# Patient Record
Sex: Female | Born: 1995
Health system: Southern US, Community
[De-identification: ages and names within clinical notes are randomized; demographics above are authoritative.]

## PROBLEM LIST (undated history)

## (undated) DIAGNOSIS — N039 Chronic nephritic syndrome with unspecified morphologic changes: Secondary | ICD-10-CM

## (undated) DIAGNOSIS — N289 Disorder of kidney and ureter, unspecified: Secondary | ICD-10-CM

---
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
## 2014-10-29 IMAGING — CR DG CHEST 2V
2 series · 2 of 2 positions shown · non-contrast
Comparison: 05/14/2012

CLINICAL DATA: Chest pain

CHEST - 2 VIEW

[w chest pa]
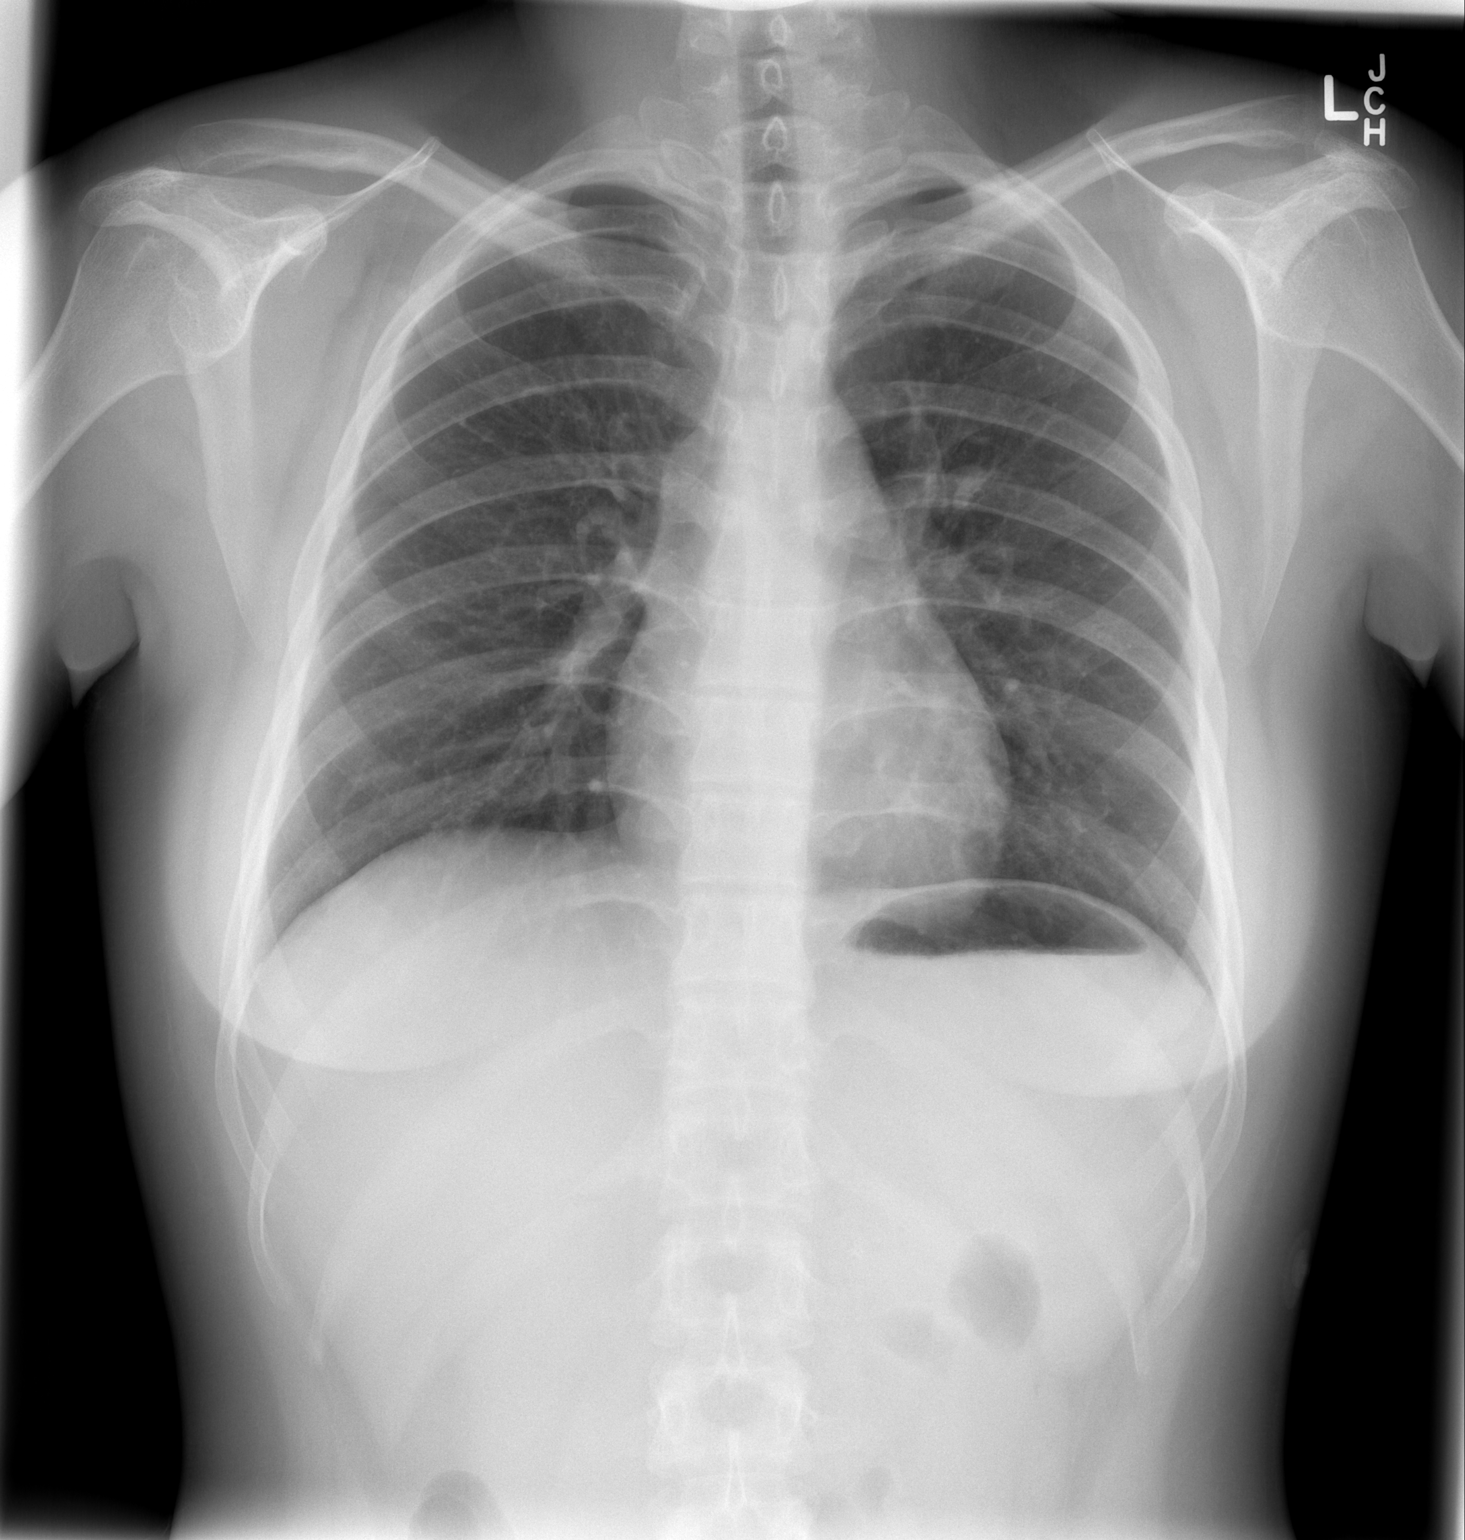

[w chest lat]
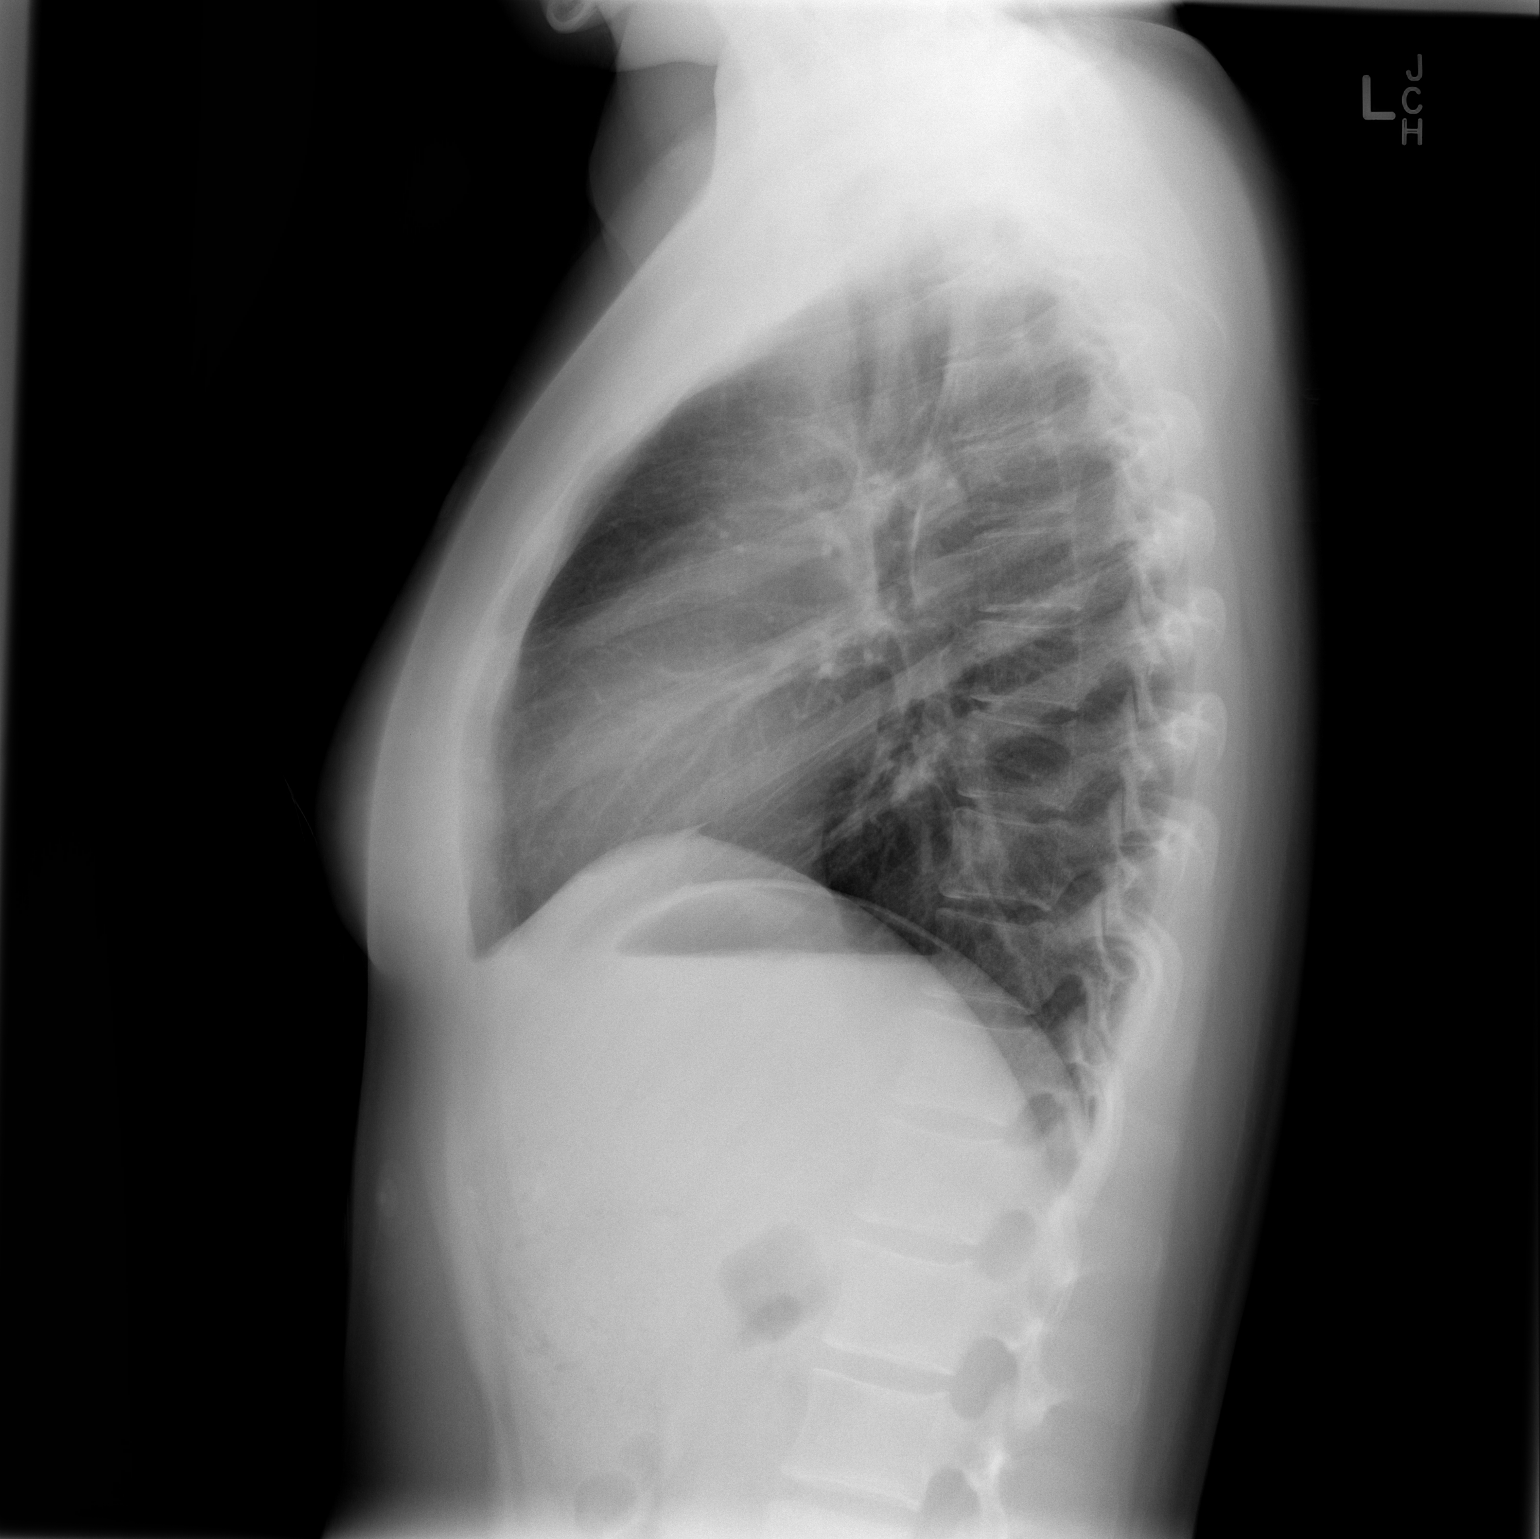

[2 of 2 positions shown; findings below may reference images not displayed]

FINDINGS: Normal heart size.  Clear lungs.  No pleural effusion.
No pneumothorax.
IMPRESSION: No active cardiopulmonary disease.

## 2017-01-17 ENCOUNTER — Other Ambulatory Visit (HOSPITAL_COMMUNITY)
Admission: RE | Admit: 2017-01-17 | Discharge: 2017-01-17 | Disposition: A | Discharge status: Home / Self Care | Payer: BLUE CROSS/BLUE SHIELD | Source: Ambulatory Visit | Attending: Family Medicine | Admitting: Family Medicine

## 2017-01-17 ENCOUNTER — Encounter: Payer: Self-pay | Admitting: Family Medicine

## 2017-01-17 ENCOUNTER — Ambulatory Visit (INDEPENDENT_AMBULATORY_CARE_PROVIDER_SITE_OTHER): Payer: BLUE CROSS/BLUE SHIELD | Admitting: Family Medicine

## 2017-01-17 ENCOUNTER — Ambulatory Visit (INDEPENDENT_AMBULATORY_CARE_PROVIDER_SITE_OTHER): Payer: BLUE CROSS/BLUE SHIELD

## 2017-01-17 VITALS — BP 126/78 | HR 83 | Temp 98.4°F | Ht 66.0 in | Wt 222.8 lb

## 2017-01-17 DIAGNOSIS — Z23 Encounter for immunization: Secondary | ICD-10-CM

## 2017-01-17 DIAGNOSIS — Z0000 Encounter for general adult medical examination without abnormal findings: Secondary | ICD-10-CM

## 2017-01-17 DIAGNOSIS — Z124 Encounter for screening for malignant neoplasm of cervix: Secondary | ICD-10-CM | POA: Diagnosis not present

## 2017-01-17 DIAGNOSIS — E669 Obesity, unspecified: Secondary | ICD-10-CM | POA: Diagnosis not present

## 2017-01-17 DIAGNOSIS — N915 Oligomenorrhea, unspecified: Secondary | ICD-10-CM | POA: Diagnosis not present

## 2017-01-17 LAB — LIPID PANEL
Cholesterol: 162 mg/dL (ref 0–200)
HDL: 38 mg/dL — ABNORMAL LOW (ref >39.00)
LDL Cholesterol: 100 mg/dL — ABNORMAL HIGH (ref 0–99)
NonHDL: 124.16
Total CHOL/HDL Ratio: 4
Triglycerides: 123 mg/dL (ref 0.0–149.0)
VLDL: 24.6 mg/dL (ref 0.0–40.0)

## 2017-01-17 LAB — CBC WITH DIFFERENTIAL/PLATELET
Basophils Absolute: 0 10*3/uL (ref 0.0–0.1)
Basophils Relative: 0.6 % (ref 0.0–3.0)
Eosinophils Absolute: 0.1 10*3/uL (ref 0.0–0.7)
Eosinophils Relative: 1.1 % (ref 0.0–5.0)
HCT: 41.4 % (ref 36.0–46.0)
Hemoglobin: 13.5 g/dL (ref 12.0–15.0)
Lymphocytes Relative: 42 % (ref 12.0–46.0)
Lymphs Abs: 2.6 10*3/uL (ref 0.7–4.0)
MCHC: 32.6 g/dL (ref 30.0–36.0)
MCV: 83.5 fl (ref 78.0–100.0)
Monocytes Absolute: 0.3 10*3/uL (ref 0.1–1.0)
Monocytes Relative: 5.5 % (ref 3.0–12.0)
Neutro Abs: 3.1 10*3/uL (ref 1.4–7.7)
Neutrophils Relative %: 50.8 % (ref 43.0–77.0)
Platelets: 263 10*3/uL (ref 150.0–400.0)
RBC: 4.96 Mil/uL (ref 3.87–5.11)
RDW: 13.3 % (ref 11.5–15.5)
WBC: 6.1 10*3/uL (ref 4.0–10.5)

## 2017-01-17 LAB — HEMOGLOBIN A1C: Hgb A1c MFr Bld: 6 % (ref 4.6–6.5)

## 2017-01-17 LAB — COMPREHENSIVE METABOLIC PANEL
ALT: 18 U/L (ref 0–35)
AST: 14 U/L (ref 0–37)
Albumin: 4.4 g/dL (ref 3.5–5.2)
Alkaline Phosphatase: 48 U/L (ref 39–117)
BUN: 11 mg/dL (ref 6–23)
CO2: 27 mEq/L (ref 19–32)
Calcium: 9.6 mg/dL (ref 8.4–10.5)
Chloride: 103 mEq/L (ref 96–112)
Creatinine, Ser: 0.68 mg/dL (ref 0.40–1.20)
GFR: 113.53 mL/min (ref >60.00)
Glucose, Bld: 99 mg/dL (ref 70–99)
Potassium: 3.9 mEq/L (ref 3.5–5.1)
Sodium: 137 mEq/L (ref 135–145)
Total Bilirubin: 0.5 mg/dL (ref 0.2–1.2)
Total Protein: 7.6 g/dL (ref 6.0–8.3)

## 2017-01-17 LAB — TSH: TSH: 2.14 u[IU]/mL (ref 0.35–4.50)

## 2017-01-17 LAB — TESTOSTERONE: Testosterone: 110.85 ng/dL — ABNORMAL HIGH (ref 15.00–40.00)

## 2017-01-17 LAB — POCT URINE PREGNANCY: Preg Test, Ur: NEGATIVE

## 2017-01-19 ENCOUNTER — Encounter: Payer: Self-pay | Admitting: Family Medicine

## 2017-01-19 LAB — CYTOLOGY - PAP
Adequacy: ABSENT
Candida vaginitis: NEGATIVE
Diagnosis: NEGATIVE

## 2017-01-20 LAB — PROLACTIN: Prolactin: 4.1 ng/mL

## 2017-01-20 LAB — DHEA-SULFATE: DHEA-SO4: 240 ug/dL (ref 18–391)

## 2017-01-20 LAB — 17-HYDROXYPROGESTERONE: 17-OH-Progesterone, LC/MS/MS: 50 ng/dL

## 2017-01-20 NOTE — Telephone Encounter (Signed)
Pt returned your call. She is scheduled to see Dr Earlene PlaterWallace on Tuesday but asked to speak with you. She said that if you were able to send her a message through MyChart to "set up a time" to speak with her she could come out of her office so that she will have cell service. Please advise.

## 2017-01-21 NOTE — Progress Notes (Signed)
Subjective:    Rebeca Valdivia is a 21 y.o. female and is here for a comprehensive physical exam.  Pertinent Gynecological History: Patient's last menstrual period was 11/28/2016. Sexually active: Yes with female. Menses: irregular and painful as well as heavy. Sexually transmitted diseases: no past history.  Health Maintenance Due  Topic Date Due  . HIV Screening  08/12/2008  . TETANUS/TDAP  08/12/2012  . CHLAMYDIA SCREENING  08/12/2013   PMHx, SurgHx, SocialHx, Medications, and Allergies were reviewed in the Visit Navigator and updated as appropriate.   Past Medical History:  Diagnosis Date  . Chronic nephritis   . Renal disorder    Family History  Problem Relation Age of Onset  . Kidney Stones Other    Social History  Substance Use Topics  . Smoking status: Never Smoker  . Smokeless tobacco: Never Used  . Alcohol use No   Review of Systems:   Pertinent items are noted in the HPI. Otherwise, ROS is negative.  Objective:   BP 126/78   Pulse 83   Temp 98.4 F (36.9 C) (Oral)   Ht 5\' 6"  (1.676 m)   Wt 222 lb 12.8 oz (101.1 kg)   LMP 11/28/2016   SpO2 98%   BMI 35.96 kg/m    Wt Readings from Last 3 Encounters:  01/17/17 222 lb 12.8 oz (101.1 kg)     Ht Readings from Last 3 Encounters:  01/17/17 5\' 6"  (1.676 m)   General appearance: alert, cooperative and appears stated age. Head: normocephalic, without obvious abnormality, atraumatic. Neck: no adenopathy, supple, symmetrical, trachea midline; thyroid not enlarged, symmetric, no tenderness/mass/nodules. Lungs: clear to auscultation bilaterally. Breasts: inspection negative, no nipple retraction or dimpling, no nipple discharge or bleeding, no axillary or supraclavicular adenopathy, normal to palpation without dominant masses. Heart: regular rate and rhythm Abdomen: soft, non-tender; no masses,  no organomegaly. Extremities: extremities normal, atraumatic, no cyanosis or edema. Skin: skin color, texture, turgor  normal, no rashes or lesions. Lymph: cervical, supraclavicular, and axillary nodes normal; no abnormal inguinal nodes palpated. Neurologic: grossly normal.  Pelvic:  External genitalia: no lesions.              Urethra: normal appearing urethra with no masses, tenderness or lesions.              Bartholins and Skenes: normal.               Vagina: normal appearing vagina with normal color and discharge, no lesions.              Cervix: normal appearance.              Pap and high risk HPV testing done: Yes.  .        Bimanual Exam:   Uterus: uterus is normal size, shape, consistency and nontender.                                      Adnexa: normal adnexa in size, nontender and no masses.                            Assessment/Plan:   Abbigael was seen today for establish care.  Diagnoses and all orders for this visit:  Routine physical examination -     CBC with Differential/Platelet -     Comprehensive metabolic panel -  TSH -     Lipid panel -     POCT urine pregnancy -     Prolactin -     Testosterone -     DHEA-sulfate -     17-Hydroxyprogesterone -     Hemoglobin A1c  Obesity (BMI 30-39.9) Comments: The patient is asked to make an attempt to improve diet and exercise patterns to aid in medical management of this problem.  Orders: -     CBC with Differential/Platelet -     Comprehensive metabolic panel -     TSH -     Lipid panel -     POCT urine pregnancy -     Prolactin -     Testosterone -     DHEA-sulfate -     17-Hydroxyprogesterone -     Hemoglobin A1c  Pap smear for cervical cancer screening -     CBC with Differential/Platelet -     Comprehensive metabolic panel -     TSH -     Lipid panel -     POCT urine pregnancy -     Prolactin -     Testosterone -     DHEA-sulfate -     17-Hydroxyprogesterone -     Hemoglobin A1c -     Cytology - PAP  Oligomenorrhea, unspecified type -     CBC with Differential/Platelet -     Comprehensive metabolic panel -      TSH -     Lipid panel -     POCT urine pregnancy -     Prolactin -     Testosterone -     DHEA-sulfate -     17-Hydroxyprogesterone -     Hemoglobin A1c   Patient Counseling:   [x]     Nutrition: Stressed importance of moderation in sodium/caffeine intake, saturated fat and cholesterol, caloric balance, sufficient intake of fresh fruits, vegetables, fiber, calcium, iron, and 1 mg of folate supplement per day (for females capable of pregnancy).   [x]      Stressed the importance of regular exercise.    [x]     Substance Abuse: Discussed cessation/primary prevention of tobacco, alcohol, or other drug use; driving or other dangerous activities under the influence; availability of treatment for abuse.    [x]      Injury prevention: Discussed safety belts, safety helmets, smoke detector, smoking near bedding or upholstery.    [x]      Sexuality: Discussed sexually transmitted diseases, partner selection, use of condoms, avoidance of unintended pregnancy  and contraceptive alternatives.    [x]     Dental health: Discussed importance of regular tooth brushing, flossing, and dental visits.   [x]      Health maintenance and immunizations reviewed. Please refer to Health maintenance section.   Helane RimaErica Torsha Lemus, DO [facility]

## 2017-01-24 ENCOUNTER — Ambulatory Visit (INDEPENDENT_AMBULATORY_CARE_PROVIDER_SITE_OTHER): Payer: BLUE CROSS/BLUE SHIELD | Admitting: Family Medicine

## 2017-01-24 ENCOUNTER — Encounter: Payer: Self-pay | Admitting: Family Medicine

## 2017-01-24 VITALS — BP 124/76 | HR 79 | Temp 98.1°F | Ht 66.0 in | Wt 222.0 lb

## 2017-01-24 DIAGNOSIS — E88819 Insulin resistance, unspecified: Secondary | ICD-10-CM | POA: Insufficient documentation

## 2017-01-24 DIAGNOSIS — N926 Irregular menstruation, unspecified: Secondary | ICD-10-CM | POA: Insufficient documentation

## 2017-01-24 DIAGNOSIS — E282 Polycystic ovarian syndrome: Secondary | ICD-10-CM | POA: Insufficient documentation

## 2017-01-24 DIAGNOSIS — E8881 Metabolic syndrome: Secondary | ICD-10-CM

## 2017-01-24 MED ORDER — NORGESTIMATE-ETH ESTRADIOL 0.25-35 MG-MCG PO TABS: 1.0000 | ORAL_TABLET | Freq: Every day | ORAL | 11 refills | Status: DC

## 2017-01-24 MED ORDER — METFORMIN HCL 1000 MG PO TABS: 1000.0000 mg | ORAL_TABLET | Freq: Every day | ORAL | 3 refills | Status: DC

## 2017-01-24 NOTE — Progress Notes (Signed)
Renee LlamasMia Gibbs is a 21 y.o. female is here for follow up.  History of Present Illness:   HPI: Lab review. See AP.  Health Maintenance Due  Topic Date Due  . HIV Screening  08/12/2008  . TETANUS/TDAP  08/12/2012  . CHLAMYDIA SCREENING  08/12/2013   Depression screen PHQ 2/9 01/17/2017  Decreased Interest 0  Down, Depressed, Hopeless 0  PHQ - 2 Score 0   PMHx, SurgHx, SocialHx, FamHx, Medications, and Allergies were reviewed in the Visit Navigator and updated as appropriate.  There are no active problems to display for this patient.  Social History  Substance Use Topics  . Smoking status: Never Smoker  . Smokeless tobacco: Never Used  . Alcohol use No   Current Medications and Allergies:   Current Outpatient Prescriptions:  .  metFORMIN (GLUCOPHAGE) 1000 MG tablet, Take 1 tablet (1,000 mg total) by mouth daily with breakfast., Disp: 30 tablet, Rfl: 3 .  norgestimate-ethinyl estradiol (SPRINTEC 28) 0.25-35 MG-MCG tablet, Take 1 tablet by mouth daily., Disp: 1 Package, Rfl: 11  No Known Allergies Review of Systems   Pertinent items are noted in the HPI. Otherwise, ROS is negative.  Vitals:   Vitals:   01/24/17 1514  BP: 124/76  Pulse: 79  Temp: 98.1 F (36.7 C)  TempSrc: Oral  SpO2: 97%  Weight: 222 lb (100.7 kg)  Height: 5\' 6"  (1.676 m)     Body mass index is 35.83 kg/m. Physical Exam:   Physical Exam  Constitutional: She appears well-nourished.  HENT:  Head: Normocephalic and atraumatic.  Eyes: Pupils are equal, round, and reactive to light. EOM are normal.  Neck: Normal range of motion. Neck supple.  Cardiovascular: Normal rate, regular rhythm, normal heart sounds and intact distal pulses.   Pulmonary/Chest: Effort normal.  Abdominal: Soft.  Skin: Skin is warm.  Psychiatric: She has a normal mood and affect. Her behavior is normal.  Nursing note and vitals reviewed.   Results for orders placed or performed in visit on 01/17/17  CBC with  Differential/Platelet  Result Value Ref Range   WBC 6.1 4.0 - 10.5 K/uL   RBC 4.96 3.87 - 5.11 Mil/uL   Hemoglobin 13.5 12.0 - 15.0 g/dL   HCT 57.841.4 46.936.0 - 62.946.0 %   MCV 83.5 78.0 - 100.0 fl   MCHC 32.6 30.0 - 36.0 g/dL   RDW 52.813.3 41.311.5 - 24.415.5 %   Platelets 263.0 150.0 - 400.0 K/uL   Neutrophils Relative % 50.8 43.0 - 77.0 %   Lymphocytes Relative 42.0 12.0 - 46.0 %   Monocytes Relative 5.5 3.0 - 12.0 %   Eosinophils Relative 1.1 0.0 - 5.0 %   Basophils Relative 0.6 0.0 - 3.0 %   Neutro Abs 3.1 1.4 - 7.7 K/uL   Lymphs Abs 2.6 0.7 - 4.0 K/uL   Monocytes Absolute 0.3 0.1 - 1.0 K/uL   Eosinophils Absolute 0.1 0.0 - 0.7 K/uL   Basophils Absolute 0.0 0.0 - 0.1 K/uL  Comprehensive metabolic panel  Result Value Ref Range   Sodium 137 135 - 145 mEq/L   Potassium 3.9 3.5 - 5.1 mEq/L   Chloride 103 96 - 112 mEq/L   CO2 27 19 - 32 mEq/L   Glucose, Bld 99 70 - 99 mg/dL   BUN 11 6 - 23 mg/dL   Creatinine, Ser 0.100.68 0.40 - 1.20 mg/dL   Total Bilirubin 0.5 0.2 - 1.2 mg/dL   Alkaline Phosphatase 48 39 - 117 U/L   AST 14  0 - 37 U/L   ALT 18 0 - 35 U/L   Total Protein 7.6 6.0 - 8.3 g/dL   Albumin 4.4 3.5 - 5.2 g/dL   Calcium 9.6 8.4 - 11.9 mg/dL   GFR 147.82 >95.62 mL/min  TSH  Result Value Ref Range   TSH 2.14 0.35 - 4.50 uIU/mL  Lipid panel  Result Value Ref Range   Cholesterol 162 0 - 200 mg/dL   Triglycerides 130.8 0.0 - 149.0 mg/dL   HDL 65.78 (L) >46.96 mg/dL   VLDL 29.5 0.0 - 28.4 mg/dL   LDL Cholesterol 132 (H) 0 - 99 mg/dL   Total CHOL/HDL Ratio 4    NonHDL 124.16   Prolactin  Result Value Ref Range   Prolactin 4.1 ng/mL  Testosterone  Result Value Ref Range   Testosterone 110.85 (H) 15.00 - 40.00 ng/dL  DHEA-sulfate  Result Value Ref Range   DHEA-SO4 240 18 - 391 mcg/dL  [id number]  Result Value Ref Range   17-OH-Progesterone, LC/MS/MS 50 ng/dL  Hemoglobin Z5G  Result Value Ref Range   Hgb A1c MFr Bld 6.0 4.6 - 6.5 %  POCT urine pregnancy  Result Value  Ref Range   Preg Test, Ur Negative Negative  Cytology - PAP  Result Value Ref Range   Adequacy      Satisfactory for evaluation  endocervical/transformation zone component ABSENT.   Diagnosis      NEGATIVE FOR INTRAEPITHELIAL LESIONS OR MALIGNANCY.   Candida vaginitis Negative for Candida species    Material Submitted CervicoVaginal Pap [ThinPrep Imaged]    CYTOLOGY - PAP PAP RESULT     Assessment and Plan:   Renee Gibbs was seen today for follow-up.  Diagnoses and all orders for this visit:  Insulin resistance -     metFORMIN (GLUCOPHAGE) 1000 MG tablet; Take 1 tablet (1,000 mg total) by mouth daily with breakfast.  PCOS (polycystic ovarian syndrome) Comments: The patient is asked to make an attempt to improve diet and exercise patterns to aid in medical management of this problem.   Irregular menses -     norgestimate-ethinyl estradiol (SPRINTEC 28) 0.25-35 MG-MCG tablet; Take 1 tablet by mouth daily.   . Reviewed expectations re: course of current medical issues. . Discussed self-management of symptoms. . Outlined signs and symptoms indicating need for more acute intervention. . Patient verbalized understanding and all questions were answered. Marland Kitchen Health Maintenance issues including appropriate healthy diet, exercise, and smoking avoidance were discussed with patient. . See orders for this visit as documented in the electronic medical record. . Patient received an After Visit Summary.   Renee Rima, DO [place] 01/24/2017

## 2017-01-25 ENCOUNTER — Encounter: Payer: Self-pay | Admitting: Family Medicine

## 2017-02-01 ENCOUNTER — Encounter: Payer: Self-pay | Admitting: Family Medicine

## 2017-05-02 ENCOUNTER — Encounter: Payer: Self-pay | Admitting: Family Medicine

## 2017-09-13 ENCOUNTER — Encounter: Payer: Self-pay | Admitting: Family Medicine

## 2017-09-28 NOTE — Progress Notes (Signed)
Subjective:    Renee Gibbs is a 22 y.o. female and is here for a comprehensive physical exam.  Current Outpatient Medications:  .  None  Health Maintenance Due  Topic Date Due  . HIV Screening  08/12/2008  . TETANUS/TDAP  08/12/2012   PMHx, SurgHx, SocialHx, Medications, and Allergies were reviewed in the Visit Navigator and updated as appropriate.   Past Medical History:  Diagnosis Date  . Chronic nephritis   . Renal disorder    History reviewed. No pertinent surgical history.  Family History  Problem Relation Age of Onset  . Kidney Stones Other    Social History   Tobacco Use  . Smoking status: Never Smoker  . Smokeless tobacco: Never Used  Substance Use Topics  . Alcohol use: No  . Drug use: No    Review of Systems:   Pertinent items are noted in the HPI. Otherwise, ROS is negative.  Objective:   BP 120/74 (BP Location: Left Arm, Patient Position: Sitting, Cuff Size: Normal)   Pulse 70   Temp 97.9 F (36.6 C) (Oral)   Ht 5\' 6"  (1.676 m)   Wt 207 lb 3.2 oz (94 kg)   SpO2 97%   BMI 33.44 kg/m    General appearance: alert, cooperative and appears stated age. Head: normocephalic, without obvious abnormality, atraumatic. Maxillary sinus ttp. No polyps.  Neck: no adenopathy, supple, symmetrical, trachea midline; thyroid not enlarged, symmetric, no tenderness/mass/nodules. Lungs: clear to auscultation bilaterally. Heart: regular rate and rhythm Abdomen: soft, non-tender; no masses,  no organomegaly. Extremities: extremities normal, atraumatic, no cyanosis or edema. Skin: skin color, texture, turgor normal, no rashes or lesions. Lymph: cervical, supraclavicular, and axillary nodes normal; no abnormal inguinal nodes palpated. Neurologic: grossly normal.  Assessment/Plan:   Renee Gibbs was seen today for annual exam.  Diagnoses and all orders for this visit:  Routine physical examination  Insulin resistance -     Hemoglobin A1c  PCOS (polycystic ovarian  syndrome) -     CBC with Differential/Platelet -     Comprehensive metabolic panel -     norgestimate-ethinyl estradiol (SPRINTEC 28) 0.25-35 MG-MCG tablet; Take 1 tablet by mouth daily.  Elevated LDL cholesterol level -     Lipid panel  Subacute maxillary sinusitis -     amoxicillin (AMOXIL) 875 MG tablet; Take 1 tablet (875 mg total) by mouth 2 (two) times daily. -     cetirizine (ZYRTEC) 10 MG tablet; Take 1 tablet (10 mg total) by mouth daily.  Irregular menses -     norgestimate-ethinyl estradiol (SPRINTEC 28) 0.25-35 MG-MCG tablet; Take 1 tablet by mouth daily.    Patient Counseling:   [x]     Nutrition: Stressed importance of moderation in sodium/caffeine intake, saturated fat and cholesterol, caloric balance, sufficient intake of fresh fruits, vegetables, fiber, calcium, iron, and 1 mg of folate supplement per day (for females capable of pregnancy).   [x]      Stressed the importance of regular exercise.    [x]     Substance Abuse: Discussed cessation/primary prevention of tobacco, alcohol, or other drug use; driving or other dangerous activities under the influence; availability of treatment for abuse.    [x]      Injury prevention: Discussed safety belts, safety helmets, smoke detector, smoking near bedding or upholstery.    [x]      Sexuality: Discussed sexually transmitted diseases, partner selection, use of condoms, avoidance of unintended pregnancy  and contraceptive alternatives.    [x]     Dental health: Discussed  importance of regular tooth brushing, flossing, and dental visits.   [x]      Health maintenance and immunizations reviewed. Please refer to Health maintenance section.   Helane Rima, DO [facility]

## 2017-09-29 ENCOUNTER — Encounter: Payer: Self-pay | Admitting: Family Medicine

## 2017-09-29 ENCOUNTER — Ambulatory Visit (INDEPENDENT_AMBULATORY_CARE_PROVIDER_SITE_OTHER): Payer: 59 | Admitting: Family Medicine

## 2017-09-29 VITALS — BP 120/74 | HR 70 | Temp 97.9°F | Ht 66.0 in | Wt 207.2 lb

## 2017-09-29 DIAGNOSIS — J0100 Acute maxillary sinusitis, unspecified: Secondary | ICD-10-CM

## 2017-09-29 DIAGNOSIS — E282 Polycystic ovarian syndrome: Secondary | ICD-10-CM | POA: Diagnosis not present

## 2017-09-29 DIAGNOSIS — E7800 Pure hypercholesterolemia, unspecified: Secondary | ICD-10-CM | POA: Insufficient documentation

## 2017-09-29 DIAGNOSIS — N926 Irregular menstruation, unspecified: Secondary | ICD-10-CM

## 2017-09-29 DIAGNOSIS — E8881 Metabolic syndrome: Secondary | ICD-10-CM

## 2017-09-29 DIAGNOSIS — E88819 Insulin resistance, unspecified: Secondary | ICD-10-CM

## 2017-09-29 DIAGNOSIS — Z0000 Encounter for general adult medical examination without abnormal findings: Secondary | ICD-10-CM

## 2017-09-29 LAB — CBC WITH DIFFERENTIAL/PLATELET
Basophils Absolute: 0 10*3/uL (ref 0.0–0.1)
Basophils Relative: 0.5 % (ref 0.0–3.0)
Eosinophils Absolute: 0.1 10*3/uL (ref 0.0–0.7)
Eosinophils Relative: 1.4 % (ref 0.0–5.0)
HCT: 41.6 % (ref 36.0–46.0)
Hemoglobin: 13.9 g/dL (ref 12.0–15.0)
Lymphocytes Relative: 41.2 % (ref 12.0–46.0)
Lymphs Abs: 2.3 10*3/uL (ref 0.7–4.0)
MCHC: 33.3 g/dL (ref 30.0–36.0)
MCV: 82.1 fl (ref 78.0–100.0)
Monocytes Absolute: 0.4 10*3/uL (ref 0.1–1.0)
Monocytes Relative: 6.9 % (ref 3.0–12.0)
Neutro Abs: 2.8 10*3/uL (ref 1.4–7.7)
Neutrophils Relative %: 50 % (ref 43.0–77.0)
Platelets: 251 10*3/uL (ref 150.0–400.0)
RBC: 5.06 Mil/uL (ref 3.87–5.11)
RDW: 13.5 % (ref 11.5–15.5)
WBC: 5.6 10*3/uL (ref 4.0–10.5)

## 2017-09-29 LAB — COMPREHENSIVE METABOLIC PANEL
ALT: 16 U/L (ref 0–35)
AST: 14 U/L (ref 0–37)
Albumin: 4.6 g/dL (ref 3.5–5.2)
Alkaline Phosphatase: 49 U/L (ref 39–117)
BUN: 14 mg/dL (ref 6–23)
CO2: 24 mEq/L (ref 19–32)
Calcium: 9.6 mg/dL (ref 8.4–10.5)
Chloride: 102 mEq/L (ref 96–112)
Creatinine, Ser: 0.65 mg/dL (ref 0.40–1.20)
GFR: 118.89 mL/min (ref >60.00)
Glucose, Bld: 89 mg/dL (ref 70–99)
Potassium: 3.9 mEq/L (ref 3.5–5.1)
Sodium: 137 mEq/L (ref 135–145)
Total Bilirubin: 0.4 mg/dL (ref 0.2–1.2)
Total Protein: 7.7 g/dL (ref 6.0–8.3)

## 2017-09-29 LAB — LIPID PANEL
Cholesterol: 152 mg/dL (ref 0–200)
HDL: 32.5 mg/dL — ABNORMAL LOW (ref >39.00)
LDL Cholesterol: 100 mg/dL — ABNORMAL HIGH (ref 0–99)
NonHDL: 119.29
Total CHOL/HDL Ratio: 5
Triglycerides: 96 mg/dL (ref 0.0–149.0)
VLDL: 19.2 mg/dL (ref 0.0–40.0)

## 2017-09-29 LAB — HEMOGLOBIN A1C: Hgb A1c MFr Bld: 5.7 % (ref 4.6–6.5)

## 2017-09-29 MED ORDER — AMOXICILLIN 875 MG PO TABS: 875.0000 mg | ORAL_TABLET | Freq: Two times a day (BID) | ORAL | 0 refills | Status: DC

## 2017-09-29 MED ORDER — CETIRIZINE HCL 10 MG PO TABS: 10.0000 mg | ORAL_TABLET | Freq: Every day | ORAL | 11 refills | Status: DC

## 2017-09-29 MED ORDER — NORGESTIMATE-ETH ESTRADIOL 0.25-35 MG-MCG PO TABS: 1.0000 | ORAL_TABLET | Freq: Every day | ORAL | 11 refills | Status: DC

## 2018-04-19 NOTE — Progress Notes (Signed)
Renee LlamasMia Gibbs is a 23 y.o. female is here for follow up.  History of Present Illness:   I, Renee Gibbs, acting as a Neurosurgeonscribe for W. R. BerkleyErica Nani Ingram, DO, have documented all relevant documentation on the behalf of Renee Renee Archit Leger, DO, as directed by  Renee Renee Gibbs Renee Evola, DO while in the presence of Renee Renee Gibbs Renee Ouk, DO.  HPI:   Insulin resistance Lab Results  Component Value Date   HGBA1C 5.4 04/20/2018   PCOS (polycystic ovarian syndrome) -     CBC with Differential/Platelet -     Comprehensive metabolic panel -     norgestimate-ethinyl estradiol (SPRINTEC 28) 0.25-35 MG-MCG tablet; Take 1 tablet by mouth daily.  Elevated LDL cholesterol level Checked at last visit on 09/29/2017.   Lab Results  Component Value Date   CHOL 152 09/29/2017   HDL 32.50 (L) 09/29/2017   LDLCALC 100 (H) 09/29/2017   TRIG 96.0 09/29/2017   CHOLHDL 5 09/29/2017   Lab Results  Component Value Date   ALT 16 09/29/2017   AST 14 09/29/2017   ALKPHOS 49 09/29/2017   BILITOT 0.4 09/29/2017     Irregular menses Worsened. Still irregular, heavy. -     norgestimate-ethinyl estradiol (SPRINTEC 28) 0.25-35 MG-MCG tablet; Take 1 tablet by mouth daily.  Health Maintenance Due  Topic Date Due  . HIV Screening  08/12/2008   [facility] 04/20/2018 09/29/2017 01/17/2017  Decreased Interest 1 0 0  Down, Depressed, Hopeless 1 0 0  PHQ - 2 Score 2 0 0  Altered sleeping 0 - -  Tired, decreased energy 2 - -  Change in appetite 0 - -  Feeling bad or failure about yourself  0 - -  Trouble concentrating 0 - -  Moving slowly or fidgety/restless 0 - -  Suicidal thoughts 0 - -  PHQ-9 Score 4 - -  Difficult doing work/chores Somewhat difficult - -   PMHx, SurgHx, SocialHx, FamHx, Medications, and Allergies were reviewed in the Visit Navigator and updated as appropriate.   Patient Active Problem List   Diagnosis Date Noted  . Elevated LDL cholesterol level 09/29/2017  . Insulin resistance 01/24/2017  . PCOS  (polycystic ovarian syndrome) 01/24/2017  . Irregular menses 01/24/2017   Social History   Tobacco Use  . Smoking status: Never Smoker  . Smokeless tobacco: Never Used  Substance Use Topics  . Alcohol use: No  . Drug use: No   Current Medications and Allergies:   .  cetirizine (ZYRTEC) 10 MG tablet, Take 1 tablet (10 mg total) by mouth daily. (Patient not taking: Reported on 04/20/2018), Disp: 30 tablet, Rfl: 11 .  Norgestimate-Ethinyl Estradiol Triphasic 0.18/0.215/0.25 MG-25 MCG tab, Take 1 tablet by mouth daily., Disp: 1 Package, Rfl: 11  Allergies  Allergen Reactions  . Metformin And Related Diarrhea     Review of Systems   Pertinent items are noted in the HPI. Otherwise, a complete ROS is negative.  Vitals:   Vitals:   04/20/18 0957  BP: 110/80  Pulse: 73  SpO2: 98%  Weight: 203 lb (92.1 kg)  Height: 5\' 6"  (1.676 m)     Body mass index is 32.77 kg/m.  Physical Exam:   Physical Exam Vitals signs and nursing note reviewed.  HENT:     Head: Normocephalic and atraumatic.  Eyes:     Pupils: Pupils are equal, round, and reactive to light.  Neck:     Musculoskeletal: Normal range of motion and neck supple.  Cardiovascular:  Rate and Rhythm: Normal rate and regular rhythm.     Heart sounds: Normal heart sounds.  Pulmonary:     Effort: Pulmonary effort is normal.  Abdominal:     Palpations: Abdomen is soft.  Skin:    General: Skin is warm.  Psychiatric:        Behavior: Behavior normal.    Results for orders placed or performed in visit on 04/20/18  POCT glycosylated hemoglobin (Hb A1C)  Result Value Ref Range   Hemoglobin A1C 5.4 4.0 - 5.6 %   HbA1c POC (<> result, manual entry)     HbA1c, POC (prediabetic range)     HbA1c, POC (controlled diabetic range)    POCT urine pregnancy  Result Value Ref Range   Preg Test, Ur Negative Negative  POCT Urinalysis Dipstick  Result Value Ref Range   Color, UA Yellow    Clarity, UA Clear    Glucose, UA  Negative Negative   Bilirubin, UA Negative    Ketones, UA Negative    Spec Grav, UA 1.025 1.010 - 1.025   Blood, UA Positive    pH, UA 6.5 5.0 - 8.0   Protein, UA Negative Negative   Urobilinogen, UA 2.0 (A) 0.2 or 1.0 E.U./dL   Nitrite, UA Negative    Leukocytes, UA Negative Negative   Appearance     Odor     Assessment and Plan:   Renee Gibbs was seen today for follow-up.  Diagnoses and all orders for this visit:  Insulin resistance -     POCT glycosylated hemoglobin (Hb A1C)  PCOS (polycystic ovarian syndrome)  Elevated LDL cholesterol level  Irregular menses -     Norgestimate-Ethinyl Estradiol Triphasic 0.18/0.215/0.25 MG-25 MCG tab; Take 1 tablet by mouth daily.  Need for Tdap vaccination -     Tdap vaccine greater than or equal to 7yo IM  Need for influenza vaccination -     Flu Vaccine QUAD 6+ mos PF IM (Fluarix Quad PF)  Lower abdominal pain -     POCT urine pregnancy -     POCT Urinalysis Dipstick    . Orders and follow up as documented in EpicCare, reviewed diet, exercise and weight control, cardiovascular risk and specific lipid/LDL goals reviewed, reviewed medications and side effects in detail.  . Reviewed expectations re: course of current medical issues. . Outlined signs and symptoms indicating need for more acute intervention. . Patient verbalized understanding and all questions were answered. . Patient received an After Visit Summary.  CMA served as Neurosurgeon during this visit. History, Physical, and Plan performed by medical provider. The above documentation has been reviewed and is accurate and complete. Renee Rima, D.O.  Renee Rima, DO [place] 04/22/2018

## 2018-04-20 ENCOUNTER — Encounter: Payer: Self-pay | Admitting: Family Medicine

## 2018-04-20 ENCOUNTER — Ambulatory Visit (INDEPENDENT_AMBULATORY_CARE_PROVIDER_SITE_OTHER): Payer: BLUE CROSS/BLUE SHIELD | Admitting: Family Medicine

## 2018-04-20 VITALS — BP 110/80 | HR 73 | Ht 66.0 in | Wt 203.0 lb

## 2018-04-20 DIAGNOSIS — N926 Irregular menstruation, unspecified: Secondary | ICD-10-CM | POA: Diagnosis not present

## 2018-04-20 DIAGNOSIS — R1030 Lower abdominal pain, unspecified: Secondary | ICD-10-CM | POA: Diagnosis not present

## 2018-04-20 DIAGNOSIS — E7800 Pure hypercholesterolemia, unspecified: Secondary | ICD-10-CM | POA: Diagnosis not present

## 2018-04-20 DIAGNOSIS — E282 Polycystic ovarian syndrome: Secondary | ICD-10-CM

## 2018-04-20 DIAGNOSIS — E8881 Metabolic syndrome: Secondary | ICD-10-CM

## 2018-04-20 DIAGNOSIS — Z23 Encounter for immunization: Secondary | ICD-10-CM | POA: Diagnosis not present

## 2018-04-20 LAB — POCT URINALYSIS DIPSTICK
Bilirubin, UA: NEGATIVE
Blood, UA: POSITIVE
Glucose, UA: NEGATIVE
Ketones, UA: NEGATIVE
Leukocytes, UA: NEGATIVE
Nitrite, UA: NEGATIVE
Protein, UA: NEGATIVE
Spec Grav, UA: 1.025 (ref 1.010–1.025)
Urobilinogen, UA: 2 E.U./dL — AB
pH, UA: 6.5 (ref 5.0–8.0)

## 2018-04-20 LAB — POCT GLYCOSYLATED HEMOGLOBIN (HGB A1C): Hemoglobin A1C: 5.4 % (ref 4.0–5.6)

## 2018-04-20 LAB — POCT URINE PREGNANCY: Preg Test, Ur: NEGATIVE

## 2018-04-20 MED ORDER — NORGESTIM-ETH ESTRAD TRIPHASIC 0.18/0.215/0.25 MG-25 MCG PO TABS: 1.0000 | ORAL_TABLET | Freq: Every day | ORAL | 11 refills | Status: DC

## 2018-04-22 ENCOUNTER — Encounter: Payer: Self-pay | Admitting: Family Medicine

## 2018-10-19 ENCOUNTER — Encounter: Payer: BLUE CROSS/BLUE SHIELD | Admitting: Family Medicine

## 2018-10-31 ENCOUNTER — Other Ambulatory Visit: Payer: Self-pay

## 2018-10-31 ENCOUNTER — Ambulatory Visit (INDEPENDENT_AMBULATORY_CARE_PROVIDER_SITE_OTHER): Payer: BC Managed Care – PPO | Admitting: Family Medicine

## 2018-10-31 ENCOUNTER — Encounter: Payer: Self-pay | Admitting: Family Medicine

## 2018-10-31 ENCOUNTER — Ambulatory Visit: Payer: Self-pay | Admitting: Family Medicine

## 2018-10-31 VITALS — BP 112/72 | HR 72 | Temp 98.2°F | Resp 16 | Ht 66.0 in | Wt 194.6 lb

## 2018-10-31 DIAGNOSIS — K649 Unspecified hemorrhoids: Secondary | ICD-10-CM | POA: Diagnosis not present

## 2018-10-31 DIAGNOSIS — E282 Polycystic ovarian syndrome: Secondary | ICD-10-CM | POA: Diagnosis not present

## 2018-10-31 DIAGNOSIS — N926 Irregular menstruation, unspecified: Secondary | ICD-10-CM | POA: Diagnosis not present

## 2018-10-31 LAB — POCT URINE PREGNANCY: Preg Test, Ur: NEGATIVE

## 2018-10-31 MED ORDER — HYDROCORTISONE ACETATE 30 MG RE SUPP: RECTAL | 0 refills | Status: DC

## 2018-10-31 NOTE — Progress Notes (Signed)
Subjective  CC:  Chief Complaint  Patient presents with  . Rectal Bleeding    She reports this happens after BMs, has had several episodes. Last night she reports there was a blood clot after having BM.Marland Kitchen She states that she was constipated and strain to use the bathroom   Same day acute visit; PCP not available. New pt to me. Chart reviewed.   HPI: Renee Gibbs is a 23 y.o. female who presents to the office today to address the problems listed above in the chief complaint.  23 yo with above reported problems. Admits to 2-3 episodes of BRBPR, mostly noted on TP when wiping. Normal stooling although a little constipated recently for unclear reasons. Admits to this happening in past intermittently over 2 years. Has felt extra "tissue" near rectum. Nonpainful defecation although has to strain at times. No abdominal pain, upper GI sxs. She is worried she has stomach cancer.   Ros neg for f/c/s, weight loss, postprandial pain, reflux sxs or melena.   PCOS with h/o irregular periods stopped ocps; now late 8 days for menses. No pelvic pain.    Assessment  1. Hemorrhoids, unspecified hemorrhoid type   2. Late menses   3. PCOS (polycystic ovarian syndrome)      Plan   hemorrhoids:  Reassured. Treat with steroid cream/supp and avoid constipation/straining. F/u if needed  Check upreg.   Overdue for cpe. Pt to schedule.   Follow up: Return for complete physical with Dr. Juleen China.  Visit date not found  Orders Placed This Encounter  Procedures  . POCT urine pregnancy   Meds ordered this encounter  Medications  . HYDROCORTISONE ACE, RECTAL, 30 MG SUPP    Sig: Place PR nightly x 5 days, then prn    Dispense:  28 suppository    Refill:  0    May substitute any affordable rectal steroid supp or cream equivalent. Thanks.      I reviewed the patients updated PMH, FH, and SocHx.    Patient Active Problem List   Diagnosis Date Noted  . Elevated LDL cholesterol level 09/29/2017  . Insulin  resistance 01/24/2017  . PCOS (polycystic ovarian syndrome) 01/24/2017  . Irregular menses 01/24/2017   No outpatient medications have been marked as taking for the 10/31/18 encounter (Office Visit) with Leamon Arnt, MD.    Allergies: Patient is allergic to metformin and related. Family History: Patient family history includes Kidney Stones in an other family member. Social History:  Patient  reports that she has never smoked. She has never used smokeless tobacco. She reports that she does not drink alcohol or use drugs.  Review of Systems: Constitutional: Negative for fever malaise or anorexia Cardiovascular: negative for chest pain Respiratory: negative for SOB or persistent cough Gastrointestinal: negative for abdominal pain  Objective  Vitals: BP 112/72   Pulse 72   Temp 98.2 F (36.8 C) (Tympanic)   Resp 16   Ht 5\' 6"  (1.676 m)   Wt 194 lb 9.6 oz (88.3 kg)   LMP 09/21/2018   SpO2 96%   BMI 31.41 kg/m  General: no acute distress , A&Ox3 HEENT: PEERL, conjunctiva normal, Oropharynx moist,neck is supple Cardiovascular:  RRR without murmur or gallop.  Respiratory:  Good breath sounds bilaterally, CTAB with normal respiratory effort Skin:  Warm, no rashes Gastrointestinal: soft, flat abdomen, normal active bowel sounds, no palpable masses, no hepatosplenomegaly, no appreciated hernias Rectal exam: external, nontender, hemorrhoid present.  Anoscopy with small internal hemorrhoid. Non-bleeding. No  fissures. No stool in vault. No mass     Commons side effects, risks, benefits, and alternatives for medications and treatment plan prescribed today were discussed, and the patient expressed understanding of the given instructions. Patient is instructed to call or message via MyChart if he/she has any questions or concerns regarding our treatment plan. No barriers to understanding were identified. We discussed Red Flag symptoms and signs in detail. Patient expressed understanding  regarding what to do in case of urgent or emergency type symptoms.   Medication list was reconciled, printed and provided to the patient in AVS. Patient instructions and summary information was reviewed with the patient as documented in the AVS. This note was prepared with assistance of Dragon voice recognition software. Occasional wrong-word or sound-a-like substitutions may have occurred due to the inherent limitations of voice recognition software

## 2018-10-31 NOTE — Patient Instructions (Addendum)
Please return for your annual complete physical; please come fasting with Dr. Earlene PlaterWallace. Your last was in July 2019.  If you have any questions or concerns, please don't hesitate to send me a message via MyChart or call the office at [phone]. Thank you for visiting with us today! It's our pleasure caring for you.   Hemorrhoids Hemorrhoids are swollen veins in and around the rectum or anus. There are two types of hemorrhoids:  Internal hemorrhoids. These occur in the veins that are just inside the rectum. They may poke through to the outside and become irritated and painful.  External hemorrhoids. These occur in the veins that are outside the anus and can be felt as a painful swelling or hard lump near the anus. Most hemorrhoids do not cause serious problems, and they can be managed with home treatments such as diet and lifestyle changes. If home treatments do not help the symptoms, procedures can be done to shrink or remove the hemorrhoids. What are the causes? This condition is caused by increased pressure in the anal area. This pressure may result from various things, including:  Constipation.  Straining to have a bowel movement.  Diarrhea.  Pregnancy.  Obesity.  Sitting for long periods of time.  Heavy lifting or other activity that causes you to strain.  Anal sex.  Riding a bike for a long period of time. What are the signs or symptoms? Symptoms of this condition include:  Pain.  Anal itching or irritation.  Rectal bleeding.  Leakage of stool (feces).  Anal swelling.  One or more lumps around the anus. How is this diagnosed? This condition can often be diagnosed through a visual exam. Other exams or tests may also be done, such as:  An exam that involves feeling the rectal area with a gloved hand (digital rectal exam).  An exam of the anal canal that is done using a small tube (anoscope).  A blood test, if you have lost a significant amount of blood.  A  test to look inside the colon using a flexible tube with a camera on the end (sigmoidoscopy or colonoscopy). How is this treated? This condition can usually be treated at home. However, various procedures may be done if dietary changes, lifestyle changes, and other home treatments do not help your symptoms. These procedures can help make the hemorrhoids smaller or remove them completely. Some of these procedures involve surgery, and others do not. Common procedures include:  Rubber band ligation. Rubber bands are placed at the base of the hemorrhoids to cut off their blood supply.  Sclerotherapy. Medicine is injected into the hemorrhoids to shrink them.  Infrared coagulation. A type of light energy is used to get rid of the hemorrhoids.  Hemorrhoidectomy surgery. The hemorrhoids are surgically removed, and the veins that supply them are tied off.  Stapled hemorrhoidopexy surgery. The surgeon staples the base of the hemorrhoid to the rectal wall. Follow these instructions at home: Eating and drinking   Eat foods that have a lot of fiber in them, such as whole grains, beans, nuts, fruits, and vegetables.  Ask your health care provider about taking products that have added fiber (fiber supplements).  Reduce the amount of fat in your diet. You can do this by eating low-fat dairy products, eating less red meat, and avoiding processed foods.  Drink enough fluid to keep your urine pale yellow. Managing pain and swelling   Take warm sitz baths for 20 minutes, 3-4 times a day to ease  pain and discomfort. You may do this in a bathtub or using a portable sitz bath that fits over the toilet.  If directed, apply ice to the affected area. Using ice packs between sitz baths may be helpful. ? Put ice in a plastic bag. ? Place a towel between your skin and the bag. ? Leave the ice on for 20 minutes, 2-3 times a day. General instructions  Take over-the-counter and prescription medicines only as told  by your health care provider.  Use medicated creams or suppositories as told.  Get regular exercise. Ask your health care provider how much and what kind of exercise is best for you. In general, you should do moderate exercise for at least 30 minutes on most days of the week (150 minutes each week). This can include activities such as walking, biking, or yoga.  Go to the bathroom when you have the urge to have a bowel movement. Do not wait.  Avoid straining to have bowel movements.  Keep the anal area dry and clean. Use wet toilet paper or moist towelettes after a bowel movement.  Do not sit on the toilet for long periods of time. This increases blood pooling and pain.  Keep all follow-up visits as told by your health care provider. This is important. Contact a health care provider if you have:  Increasing pain and swelling that are not controlled by treatment or medicine.  Difficulty having a bowel movement, or you are unable to have a bowel movement.  Pain or inflammation outside the area of the hemorrhoids. Get help right away if you have:  Uncontrolled bleeding from your rectum. Summary  Hemorrhoids are swollen veins in and around the rectum or anus.  Most hemorrhoids can be managed with home treatments such as diet and lifestyle changes.  Taking warm sitz baths can help ease pain and discomfort.  In severe cases, procedures or surgery can be done to shrink or remove the hemorrhoids. This information is not intended to replace advice given to you by your health care provider. Make sure you discuss any questions you have with your health care provider. Document Released: 03/11/2000 Document Revised: 03/22/2018 Document Reviewed: 08/03/2017 Elsevier Patient Education  2020 [employer]   Constipation, Adult Constipation is when a person has fewer bowel movements in a week than normal, has difficulty having a bowel movement, or has stools that are dry, hard, or larger than  normal. Constipation may be caused by an underlying condition. It may become worse with age if a person takes certain medicines and does not take in enough fluids. Follow these instructions at home: Eating and drinking   Eat foods that have a lot of fiber, such as fresh fruits and vegetables, whole grains, and beans.  Limit foods that are high in fat, low in fiber, or overly processed, such as french fries, hamburgers, cookies, candies, and soda.  Drink enough fluid to keep your urine clear or pale yellow. General instructions  Exercise regularly or as told by your health care provider.  Go to the restroom when you have the urge to go. Do not hold it in.  Take over-the-counter and prescription medicines only as told by your health care provider. These include any fiber supplements.  Practice pelvic floor retraining exercises, such as deep breathing while relaxing the lower abdomen and pelvic floor relaxation during bowel movements.  Watch your condition for any changes.  Keep all follow-up visits as told by your health care provider. This is important.  Contact a health care provider if:  You have pain that gets worse.  You have a fever.  You do not have a bowel movement after 4 days.  You vomit.  You are not hungry.  You lose weight.  You are bleeding from the anus.  You have thin, pencil-like stools. Get help right away if:  You have a fever and your symptoms suddenly get worse.  You leak stool or have blood in your stool.  Your abdomen is bloated.  You have severe pain in your abdomen.  You feel dizzy or you faint. This information is not intended to replace advice given to you by your health care provider. Make sure you discuss any questions you have with your health care provider. Document Released: 12/11/2003 Document Revised: 02/24/2017 Document Reviewed: 09/02/2015 Elsevier Patient Education  2020 [employer].

## 2018-10-31 NOTE — Telephone Encounter (Signed)
Patient added to you today at 2pm. Patient states last night she had "clot of bright red blood" after having bowel movement about the size of thumb nail. She has history of constipation in the past. She has noticed bright red blood after bowel movement when whipping. No other symptoms. LMP is three days late.

## 2018-10-31 NOTE — Telephone Encounter (Signed)
Pt. Reports she started seeing blood when she wipes after a bowel movement 1 month ago. Yesterday she passed a blood clot. No fever or abdominal pain. Reports stools have been hard. Spoke with [facility] in the practice and will forward triage. Answer Assessment - Initial Assessment Questions 1. APPEARANCE of BLOOD: "What color is it?" "Is it passed separately, on the surface of the stool, or mixed in with the stool?"      Bright red 2. AMOUNT: "How much blood was passed?"      Moderate 3. FREQUENCY: "How many times has blood been passed with the stools?"      2 times 4. ONSET: "When was the blood first seen in the stools?" (Days or weeks)      I month ago 5. DIARRHEA: "Is there also some diarrhea?" If so, ask: "How many diarrhea stools were passed in past 24 hours?"      No 6. CONSTIPATION: "Do you have constipation?" If so, "How bad is it?"     Stools hard 7. RECURRENT SYMPTOMS: "Have you had blood in your stools before?" If so, ask: "When was the last time?" and "What happened that time?"        No 8. BLOOD THINNERS: "Do you take any blood thinners?" (e.g., Coumadin/warfarin, Pradaxa/dabigatran, aspirin)     No 9. OTHER SYMPTOMS: "Do you have any other symptoms?"  (e.g., abdominal pain, vomiting, dizziness, fever)     No 10. PREGNANCY: "Is there any chance you are pregnant?" "When was your last menstrual period?"       No  Protocols used: RECTAL BLEEDING-A-AH

## 2018-11-15 NOTE — Progress Notes (Deleted)
   Subjective:    Renee Gibbs is a 23 y.o. female and is here for a comprehensive physical exam.   Current Outpatient Medications:  .  HYDROCORTISONE ACE, RECTAL, 30 MG SUPP, Place PR nightly x 5 days, then prn, Disp: 28 suppository, Rfl: 0  Health Maintenance Due  Topic Date Due  . HIV Screening  08/12/2008  . INFLUENZA VACCINE  10/27/2018    PMHx, SurgHx, SocialHx, Medications, and Allergies were reviewed in the Visit Navigator and updated as appropriate.   Past Medical History:  Diagnosis Date  . Chronic nephritis   . Renal disorder     No past surgical history on file.  Family History  Problem Relation Age of Onset  . Kidney Stones Other     Social History   Tobacco Use  . Smoking status: Never Smoker  . Smokeless tobacco: Never Used  Substance Use Topics  . Alcohol use: No  . Drug use: No    Review of Systems:   Pertinent items are noted in the HPI. Otherwise, ROS is negative.  Objective:   There were no vitals taken for this visit.   General appearance: alert, cooperative and appears stated age. Head: normocephalic, without obvious abnormality, atraumatic. Neck: no adenopathy, supple, symmetrical, trachea midline; thyroid not enlarged, symmetric, no tenderness/mass/nodules. Lungs: clear to auscultation bilaterally. Breasts: inspection negative, no nipple retraction or dimpling, no nipple discharge or bleeding, no axillary or supraclavicular adenopathy, normal to palpation without dominant masses. Heart: regular rate and rhythm Abdomen: soft, non-tender; no masses,  no organomegaly. Extremities: extremities normal, atraumatic, no cyanosis or edema. Skin: skin color, texture, turgor normal, no rashes or lesions. Lymph: cervical, supraclavicular, and axillary nodes normal; no abnormal inguinal nodes palpated. Neurologic: grossly normal.  Pelvic:  External genitalia: no lesions. Urethra: normal appearing urethra with no masses, tenderness or lesions.  Bartholins and Skenes: normal. Vagina: normal appearing vagina with normal color and discharge, no lesions. Cervix: normal appearance. Pap and high risk HPV testing done: {yes no:314532} Bimanual Exam:   Uterus: uterus is normal size, shape, consistency and nontender. Adnexa: normal adnexa in size, nontender and no masses.  Assessment/Plan:   There are no diagnoses linked to this encounter.  Patient Counseling:   [x]     Nutrition: Stressed importance of moderation in sodium/caffeine intake, saturated fat and cholesterol, caloric balance, sufficient intake of fresh fruits, vegetables, fiber, calcium, iron, and 1 mg of folate supplement per day (for females capable of pregnancy).   [x]      Stressed the importance of regular exercise.    [x]     Substance Abuse: Discussed cessation/primary prevention of tobacco, alcohol, or other drug use; driving or other dangerous activities under the influence; availability of treatment for abuse.    [x]      Injury prevention: Discussed safety belts, safety helmets, smoke detector, smoking near bedding or upholstery.    [x]      Sexuality: Discussed sexually transmitted diseases, partner selection, use of condoms, avoidance of unintended pregnancy  and contraceptive alternatives.    [x]     Dental health: Discussed importance of regular tooth brushing, flossing, and dental visits.   [x]      Health maintenance and immunizations reviewed. Please refer to Health maintenance section.   Briscoe Deutscher, DO [place]

## 2018-11-16 ENCOUNTER — Encounter: Payer: BC Managed Care – PPO | Admitting: Family Medicine

## 2018-11-26 ENCOUNTER — Other Ambulatory Visit (HOSPITAL_COMMUNITY)
Admission: RE | Admit: 2018-11-26 | Discharge: 2018-11-26 | Disposition: A | Discharge status: Home / Self Care | Payer: BC Managed Care – PPO | Source: Ambulatory Visit | Attending: Family Medicine | Admitting: Family Medicine

## 2018-11-26 ENCOUNTER — Other Ambulatory Visit: Payer: Self-pay

## 2018-11-26 ENCOUNTER — Encounter: Payer: Self-pay | Admitting: Family Medicine

## 2018-11-26 ENCOUNTER — Ambulatory Visit (INDEPENDENT_AMBULATORY_CARE_PROVIDER_SITE_OTHER): Payer: BC Managed Care – PPO | Admitting: Family Medicine

## 2018-11-26 VITALS — BP 120/72 | HR 91 | Temp 97.7°F | Ht 66.0 in | Wt 195.4 lb

## 2018-11-26 DIAGNOSIS — E669 Obesity, unspecified: Secondary | ICD-10-CM | POA: Diagnosis not present

## 2018-11-26 DIAGNOSIS — R5381 Other malaise: Secondary | ICD-10-CM | POA: Diagnosis not present

## 2018-11-26 DIAGNOSIS — E7800 Pure hypercholesterolemia, unspecified: Secondary | ICD-10-CM | POA: Insufficient documentation

## 2018-11-26 DIAGNOSIS — Z3169 Encounter for other general counseling and advice on procreation: Secondary | ICD-10-CM | POA: Insufficient documentation

## 2018-11-26 DIAGNOSIS — Z113 Encounter for screening for infections with a predominantly sexual mode of transmission: Secondary | ICD-10-CM

## 2018-11-26 DIAGNOSIS — E8881 Metabolic syndrome: Secondary | ICD-10-CM | POA: Diagnosis not present

## 2018-11-26 DIAGNOSIS — E282 Polycystic ovarian syndrome: Secondary | ICD-10-CM | POA: Diagnosis not present

## 2018-11-26 DIAGNOSIS — E88819 Insulin resistance, unspecified: Secondary | ICD-10-CM

## 2018-11-26 DIAGNOSIS — R5383 Other fatigue: Secondary | ICD-10-CM | POA: Diagnosis not present

## 2018-11-26 DIAGNOSIS — Z0000 Encounter for general adult medical examination without abnormal findings: Secondary | ICD-10-CM

## 2018-11-26 DIAGNOSIS — Z124 Encounter for screening for malignant neoplasm of cervix: Secondary | ICD-10-CM

## 2018-11-26 LAB — CBC WITH DIFFERENTIAL/PLATELET
Basophils Absolute: 0 10*3/uL (ref 0.0–0.1)
Basophils Relative: 0.4 % (ref 0.0–3.0)
Eosinophils Absolute: 0.1 10*3/uL (ref 0.0–0.7)
Eosinophils Relative: 1.2 % (ref 0.0–5.0)
HCT: 38.9 % (ref 36.0–46.0)
Hemoglobin: 12.9 g/dL (ref 12.0–15.0)
Lymphocytes Relative: 39 % (ref 12.0–46.0)
Lymphs Abs: 2.2 10*3/uL (ref 0.7–4.0)
MCHC: 33.1 g/dL (ref 30.0–36.0)
MCV: 82.4 fl (ref 78.0–100.0)
Monocytes Absolute: 0.3 10*3/uL (ref 0.1–1.0)
Monocytes Relative: 5.7 % (ref 3.0–12.0)
Neutro Abs: 3 10*3/uL (ref 1.4–7.7)
Neutrophils Relative %: 53.7 % (ref 43.0–77.0)
Platelets: 245 10*3/uL (ref 150.0–400.0)
RBC: 4.73 Mil/uL (ref 3.87–5.11)
RDW: 12.9 % (ref 11.5–15.5)
WBC: 5.6 10*3/uL (ref 4.0–10.5)

## 2018-11-26 LAB — LIPID PANEL
Cholesterol: 165 mg/dL (ref 0–200)
HDL: 41.9 mg/dL (ref >39.00)
LDL Cholesterol: 101 mg/dL — ABNORMAL HIGH (ref 0–99)
NonHDL: 123.49
Total CHOL/HDL Ratio: 4
Triglycerides: 113 mg/dL (ref 0.0–149.0)
VLDL: 22.6 mg/dL (ref 0.0–40.0)

## 2018-11-26 LAB — COMPREHENSIVE METABOLIC PANEL
ALT: 9 U/L (ref 0–35)
AST: 10 U/L (ref 0–37)
Albumin: 4.6 g/dL (ref 3.5–5.2)
Alkaline Phosphatase: 44 U/L (ref 39–117)
BUN: 12 mg/dL (ref 6–23)
CO2: 28 mEq/L (ref 19–32)
Calcium: 9.7 mg/dL (ref 8.4–10.5)
Chloride: 103 mEq/L (ref 96–112)
Creatinine, Ser: 0.68 mg/dL (ref 0.40–1.20)
GFR: 105.18 mL/min (ref >60.00)
Glucose, Bld: 87 mg/dL (ref 70–99)
Potassium: 4.2 mEq/L (ref 3.5–5.1)
Sodium: 139 mEq/L (ref 135–145)
Total Bilirubin: 0.6 mg/dL (ref 0.2–1.2)
Total Protein: 7.6 g/dL (ref 6.0–8.3)

## 2018-11-26 LAB — MAGNESIUM: Magnesium: 1.9 mg/dL (ref 1.5–2.5)

## 2018-11-26 LAB — VITAMIN B12: Vitamin B-12: 602 pg/mL (ref 211–911)

## 2018-11-26 LAB — HEMOGLOBIN A1C: Hgb A1c MFr Bld: 5.7 % (ref 4.6–6.5)

## 2018-11-26 LAB — TSH: TSH: 1.71 u[IU]/mL (ref 0.35–4.50)

## 2018-11-26 NOTE — Progress Notes (Signed)
Subjective:    Renee Gibbs is a 23 y.o. female and is here for a comprehensive physical exam.  Patient is interested in conception in about a year.  Currently using condoms for protection.  History of PCOS.  Not on medication at this time.  Had severe diarrhea with metformin previously.  No current outpatient medications on file.  Health Maintenance Due  Topic Date Due  . HIV Screening  08/12/2008  . PAP SMEAR-Modifier  01/17/2018  . INFLUENZA VACCINE  10/27/2018   PMHx, SurgHx, SocialHx, Medications, and Allergies were reviewed in the Visit Navigator and updated as appropriate.   Past Medical History:  Diagnosis Date  . Chronic nephritis   . Renal disorder    History reviewed. No pertinent surgical history.   Family History  Problem Relation Age of Onset  . Kidney Stones Other    Social History   Tobacco Use  . Smoking status: Never Smoker  . Smokeless tobacco: Never Used  Substance Use Topics  . Alcohol use: No  . Drug use: No   Review of Systems:   Pertinent items are noted in the HPI. Otherwise, ROS is negative.  Objective:   BP 120/72   Pulse 91   Temp 97.7 F (36.5 C) (Oral)   Ht 5\' 6"  (1.676 m)   Wt 195 lb 6.4 oz (88.6 kg)   SpO2 96%   BMI 31.54 kg/m    General appearance: alert, cooperative and appears stated age. Head: normocephalic, without obvious abnormality, atraumatic. Neck: no adenopathy, supple, symmetrical, trachea midline; thyroid not enlarged, symmetric, no tenderness/mass/nodules. Lungs: clear to auscultation bilaterally. Heart: regular rate and rhythm Abdomen: soft, non-tender; no masses,  no organomegaly. Extremities: extremities normal, atraumatic, no cyanosis or edema. Skin: skin color, texture, turgor normal, no rashes or lesions. Lymph: cervical, supraclavicular, and axillary nodes normal; no abnormal inguinal nodes palpated. Neurologic: grossly normal.  Pelvic:  External genitalia: no lesions. Urethra: normal appearing urethra  with no masses, tenderness or lesions. Bartholins and Skenes: normal. Vagina: normal appearing vagina with normal color and discharge, no lesions. Cervix: normal appearance. Pap done: Yes.   Bimanual Exam:   Uterus: uterus is normal size, shape, consistency and nontender. Adnexa: normal adnexa in size, nontender and no masses.  Assessment/Plan:   Marely was seen today for annual exam.  Diagnoses and all orders for this visit:  Pre-conception counseling -     Ambulatory referral to Obstetrics / Gynecology  PCOS (polycystic ovarian syndrome) -     CBC with Differential/Platelet -     Comprehensive metabolic panel  Insulin resistance -     Hemoglobin A1c  Elevated LDL cholesterol level -     Lipid panel  Obesity (BMI 30-39.9)  Pap smear for cervical cancer screening -     Cytology - PAP  Malaise and fatigue -     CBC with Differential/Platelet -     Comprehensive metabolic panel -     Magnesium -     Vitamin B12 -     TSH  Screen for STD (sexually transmitted disease) -     Cervicovaginal ancillary only( [facility])    Patient Counseling:   [x]     Nutrition: Stressed importance of moderation in sodium/caffeine intake, saturated fat and cholesterol, caloric balance, sufficient intake of fresh fruits, vegetables, fiber, calcium, iron, and 1 mg of folate supplement per day (for females capable of pregnancy).   [x]      Stressed the importance of regular exercise.    [  x]    Substance Abuse: Discussed cessation/primary prevention of tobacco, alcohol, or other drug use; driving or other dangerous activities under the influence; availability of treatment for abuse.    [x]      Injury prevention: Discussed safety belts, safety helmets, smoke detector, smoking near bedding or upholstery.    [x]      Sexuality: Discussed sexually transmitted diseases, partner selection, use of condoms, avoidance of unintended pregnancy  and contraceptive alternatives.    [x]     Dental health:  Discussed importance of regular tooth brushing, flossing, and dental visits.   [x]      Health maintenance and immunizations reviewed. Please refer to Health maintenance section.   Helane RimaErica Aaric Dolph, DO [facility]

## 2018-11-26 NOTE — Patient Instructions (Signed)
Health Maintenance Due  Topic Date Due  . HIV Screening  08/12/2008  . PAP SMEAR-Modifier  01/17/2018  . INFLUENZA VACCINE  10/27/2018    Depression screen [facility] 2/9 11/26/2018 04/20/2018 09/29/2017  Decreased Interest 2 1 0  Down, Depressed, Hopeless 1 1 0  PHQ - 2 Score 3 2 0  Altered sleeping 0 0 -  Tired, decreased energy 2 2 -  Change in appetite 1 0 -  Feeling bad or failure about yourself  0 0 -  Trouble concentrating 0 0 -  Moving slowly or fidgety/restless 0 0 -  Suicidal thoughts 0 0 -  PHQ-9 Score 6 4 -  Difficult doing work/chores Not difficult at all Somewhat difficult -

## 2018-11-27 LAB — CYTOLOGY - PAP: Diagnosis: NEGATIVE

## 2018-11-28 LAB — CERVICOVAGINAL ANCILLARY ONLY
Bacterial vaginitis: NEGATIVE
Candida vaginitis: NEGATIVE
Chlamydia: NEGATIVE
Neisseria Gonorrhea: NEGATIVE
Trichomonas: NEGATIVE

## 2018-11-29 ENCOUNTER — Encounter: Payer: Self-pay | Admitting: Family Medicine

## 2018-12-05 DIAGNOSIS — Z3169 Encounter for other general counseling and advice on procreation: Secondary | ICD-10-CM | POA: Diagnosis not present

## 2018-12-05 DIAGNOSIS — E282 Polycystic ovarian syndrome: Secondary | ICD-10-CM | POA: Diagnosis not present

## 2018-12-05 DIAGNOSIS — Z3149 Encounter for other procreative investigation and testing: Secondary | ICD-10-CM | POA: Diagnosis not present

## 2018-12-05 DIAGNOSIS — N898 Other specified noninflammatory disorders of vagina: Secondary | ICD-10-CM | POA: Diagnosis not present

## 2019-01-02 ENCOUNTER — Encounter: Payer: Self-pay | Admitting: Family Medicine

## 2019-01-02 ENCOUNTER — Telehealth: Payer: Self-pay | Admitting: Family Medicine

## 2019-01-02 NOTE — Telephone Encounter (Signed)
See below

## 2019-01-02 NOTE — Telephone Encounter (Signed)
Patient is calling wanting to talk to Dr. Juleen China or someone in the office about her last visit she had on 11/26/18. Please call patient back, thanks.

## 2019-01-07 NOTE — Telephone Encounter (Signed)
After speaking with [facility] coding department I contacted the patient and provided the following response from coding.  "We are unable to the bill for the PAP and STD screen since sent over to the hospital lab, (out of our billing area) but it may have been too soon for the pap. The diagnosis that are on the lab orders look correct according to the [facility] preventive guidelines. Coding recommends for the patient to contact insurance and see why it was not paid. Coding can see that they were using preconception counseling, but the diagnosis on order is on their list of covered dx. Coding suggested that the patient could check with billing and see what it was submitted with to the insurance too. All other labs are coded correct according to Dr. Juleen China documentation."   I provided the patient with Physician Account Services number [phone] and with Patient Financial Services number [phone] to assist further.   Patient stated she understood and would contact her insurance and the billing department.   No need to route note, for documentation purposes only.

## 2019-01-09 NOTE — Telephone Encounter (Signed)
Addressed on the phone and message sent to get information last week. See chart for information.

## 2019-01-17 ENCOUNTER — Encounter: Payer: Self-pay | Admitting: Family Medicine

## 2019-01-18 NOTE — Telephone Encounter (Signed)
Good Morning,   Dawn I am not sure how to get this adjusted as I am also not involved with coding so this is foreign to me. Can you help get this resolved for the patient?

## 2019-03-20 NOTE — Telephone Encounter (Signed)
Last note received via email on 01/18/2019 from [place]. In pathology stating: "she will contact the patient to explain. I have reviewed the charges although done as part of her preventive services [employer] still applies the lab services to any deductible and co-insurance that is why she is getting billed for the 227.70.  In reviewing her visit history her last preventive visit on  11/17/2016, she owed a balance and it was processed the same  and was applied to  her deductible and co-insurance."  No further action required. No need to route note, for documentation purposes only.

## 2019-06-05 ENCOUNTER — Other Ambulatory Visit: Payer: Self-pay | Admitting: Family Medicine

## 2019-06-05 DIAGNOSIS — E282 Polycystic ovarian syndrome: Secondary | ICD-10-CM

## 2019-06-05 DIAGNOSIS — N926 Irregular menstruation, unspecified: Secondary | ICD-10-CM

## 2019-09-25 ENCOUNTER — Other Ambulatory Visit: Payer: Self-pay

## 2019-09-25 ENCOUNTER — Ambulatory Visit (INDEPENDENT_AMBULATORY_CARE_PROVIDER_SITE_OTHER): Payer: BLUE CROSS/BLUE SHIELD | Admitting: Family Medicine

## 2019-09-25 ENCOUNTER — Encounter: Payer: Self-pay | Admitting: Family Medicine

## 2019-09-25 ENCOUNTER — Telehealth: Payer: Self-pay | Admitting: Family Medicine

## 2019-09-25 VITALS — BP 100/68 | HR 89 | Temp 98.2°F | Ht 66.0 in | Wt 179.6 lb

## 2019-09-25 DIAGNOSIS — R7303 Prediabetes: Secondary | ICD-10-CM | POA: Diagnosis not present

## 2019-09-25 DIAGNOSIS — F320 Major depressive disorder, single episode, mild: Secondary | ICD-10-CM

## 2019-09-25 DIAGNOSIS — R42 Dizziness and giddiness: Secondary | ICD-10-CM | POA: Diagnosis not present

## 2019-09-25 NOTE — Telephone Encounter (Signed)
Nurse Assessment Nurse: Renee Hacker, RN, Renee Gibbs Date/Time (Eastern Time): 09/25/2019 9:34:55 AM Confirm and document reason for call. If symptomatic, describe symptoms. ---Caller states that she needs an appointment for depression. She has been dealing with this for a while (January to present), but this is the first time she reached out. She also hasn't had her period for 2 months. Also, c/o earaches bilateral for 1-2 months, not every day, on/off. Rates 5/10 pain intensity. No nasal congestion, cough, or fever. Has the patient had close contact with a person known or suspected to have the novel coronavirus illness OR traveled / lives in area with major community spread (including international travel) in the last 14 days from the onset of symptoms? * If Asymptomatic, screen for exposure and travel within the last 14 days. ---No Does the patient have any new or worsening symptoms? ---Yes Will a triage be completed? ---Yes Related visit to physician within the last 2 weeks? ---No Does the PT have any chronic conditions? (i.e. diabetes, asthma, this includes High risk factors for pregnancy, etc.) ---Yes List chronic conditions. ---high blood sugar - pre DM Is the patient pregnant or possibly pregnant? (Ask all females between the ages of 35-55) ---No Is this a behavioral health or substance abuse call? ---YesPLEASE NOTE: All timestamps contained within this report are represented as [place] Standard Time. CONFIDENTIALTY NOTICE: This fax transmission is intended only for the addressee. It contains information that is legally privileged, confidential or otherwise protected from use or disclosure. If you are not the intended recipient, you are strictly prohibited from reviewing, disclosing, copying using or disseminating any of this information or taking any action in reliance on or regarding this information. If you have received this fax in error, please notify Renee Gibbs immediately by telephone Gibbs that  we can arrange for its return to Renee Gibbs. Phone: [phone], Toll-Free: [phone], Fax: [phone] Page: 2 of 3 Call Id: [id number] Nurse Assessment Are you having any thoughts or feelings of harming or killing yourself or someone else? ---No Are you currently experiencing any physical discomfort that you think may be related to the use of alcohol or other drugs? (use substance abuse or alcohol abuse guidelines. These include withdrawal symptoms) ---No Do you worry that you may be hearing or seeing things that others do not? ---No Do you take medications for your condition(s)? ---No Guidelines Guideline Title Affirmed Question Affirmed Notes Nurse Date/Time (Eastern Time) Depression [1] Depression AND [2] worsening (e.g.,sleeping poorly, less able to do activities of daily living) Renee Gibbs, [place], [facility] 09/25/2019 9:39:53 AM Ear - Swimmer's Yellow or green discharge from ear canal Renee Hacker, RN, [facility] 09/25/2019 9:42:24 AM Disp. Time Renee Gibbs Time) Disposition Final User 09/25/2019 9:26:17 AM Attempt made - no message left Renee Gibbs 09/25/2019 9:42:13 AM See PCP within [address], [place], [name] 09/25/2019 9:44:07 AM See PCP within 24 Hours Yes Renee Hacker, RN, Renee Gibbs Caller Disagree/Comply Comply Caller Understands Yes PreDisposition Go to Urgent Care/Walk-In Clinic Care Advice Given Per Guideline SEE PCP WITHIN 24 HOURS: * IF OFFICE WILL BE OPEN: You need to be examined within the next 24 hours. Call your doctor (or NP/PA) when the office opens and make an appointment. REASSURANCE AND EDUCATION: * People with depression do get through this -- even people who feel as badly as you feel now. You can be helped. * Encourage the caller to talk about his/her problems and feelings. * Offer hope. CALL BACK IF: * You feel like harming yourself * You become worse. CARE ADVICE given  per Depression (Adult) guideline. SEE PCP WITHIN 24 HOURS: * IF OFFICE WILL BE OPEN: You need to be examined within  the next 24 hours. Call your doctor (or NP/PA) when the office opens and make an appointment. REASSURANCE AND EDUCATION - EAR DISCHARGE: * The discharge is probably from a draining ear infection. * Wipe away the discharge with a wet cotton ball as needed. Reason: the discharge can cause skin irritation. PAIN MEDICINES: * For pain relief, you can take either acetaminophen, ibuprofen, or naproxen. * Before taking any medicine, read all the instructions on the package. CALL BACK IF: * Fever occurs * You become worse. CARE ADVICE given per Ear - Swimmer's (Adult) guideline

## 2019-09-25 NOTE — Patient Instructions (Addendum)
1) lab work today to make sure nothing contributing to your depression/other symptoms.   2) starting you on low dose prozac at 20mg /day. Take in the AM. Takes about 3-4 weeks to get into your system so dont' expect a miracle over night.   3) really recommend counseling and exercise. Card given for .   4) i'll see you a month, but email if any issues in the mean time.  5) would get eyes checked as well!   6) for sleep: ashwagandha 300mg  before bed.   So nice to meet you,  Dr. [employer]

## 2019-09-25 NOTE — Progress Notes (Signed)
Patient: Renee Gibbs MRN: [id number] DOB: 11-03-1995 PCP: Helane Rima, DO     Subjective:  Chief Complaint  Patient presents with  . Depression    HPI: The patient is a 24 y.o. female who presents today for Depression.  Pt says that she is currently going through a divorce since January. She states she has moved out and is living on own. She states she can not sleep (x3-4 months) and her appetite has been bad. She was throwing up everything she ate for about 2-3 weeks. This is better and her eating is better.    She states she feels like her body is resting, but her mind is still awake. She feels down and dreadful and doesn't see hope in anything. She couldn't hardly go to work and started crying in the office. She states she can not stop her crying. Her husband cheated on her and they are both in agreement to get a divorce. She does struggle with thinking of going back with him, but she doesn't really want to stay married. Her parents are in [place], but four hours away and communication is an issue with her parents. She does have friends, but they are busy. She has never seen a Veterinary surgeon. She has no thoughts of suicide and no history of depression in the past. She is not on a regular exercise routine.   She has episodes of dizziness, but states it's not really dizziness. It feels like her head is "bloated" and she doesn't feel like thinking or doing anything. Has an achy feeling. No chest pain, palpations, shortness of breath or vision changes.   Review of Systems  Respiratory: Negative for chest tightness and shortness of breath.   Cardiovascular: Negative for chest pain and palpitations.  Neurological: Positive for dizziness and headaches. Negative for light-headedness.  Psychiatric/Behavioral: Positive for dysphoric mood and sleep disturbance. Negative for confusion, hallucinations and suicidal ideas.    Allergies Patient is allergic to metformin and related.  Past Medical  History Patient  has a past medical history of Chronic nephritis and Renal disorder.  Surgical History Patient  has no past surgical history on file.  Family History Pateint's family history includes Kidney Stones in an other family member.  Social History Patient  reports that she has never smoked. She has never used smokeless tobacco. She reports that she does not drink alcohol and does not use drugs.    Objective: Vitals:   09/25/19 1458  BP: 100/68  Pulse: 89  Temp: 98.2 F (36.8 C)  TempSrc: Temporal  SpO2: 98%  Weight: 179 lb 9.6 oz (81.5 kg)  Height: 5\' 6"  (1.676 m)    Body mass index is 28.99 kg/m.  Physical Exam Vitals reviewed.  Constitutional:      Appearance: Normal appearance. She is well-developed.  HENT:     Head: Normocephalic and atraumatic.     Right Ear: Tympanic membrane, ear canal and external ear normal.     Left Ear: Tympanic membrane, ear canal and external ear normal.     Nose: Nose normal.     Mouth/Throat:     Mouth: Mucous membranes are moist.  Eyes:     Extraocular Movements: Extraocular movements intact.     Conjunctiva/sclera: Conjunctivae normal.     Pupils: Pupils are equal, round, and reactive to light.  Neck:     Thyroid: No thyromegaly.  Cardiovascular:     Rate and Rhythm: Normal rate and regular rhythm.     Heart sounds: Normal  heart sounds. No murmur heard.   Pulmonary:     Effort: Pulmonary effort is normal.     Breath sounds: Normal breath sounds.  Abdominal:     General: Abdomen is flat. Bowel sounds are normal. There is no distension.     Palpations: Abdomen is soft.     Tenderness: There is no abdominal tenderness.  Musculoskeletal:     Cervical back: Normal range of motion and neck supple.  Lymphadenopathy:     Cervical: No cervical adenopathy.  Skin:    General: Skin is warm and dry.     Capillary Refill: Capillary refill takes less than 2 seconds.     Findings: No rash.  Neurological:     General: No focal  deficit present.     Mental Status: She is alert and oriented to person, place, and time.     Cranial Nerves: No cranial nerve deficit.     Coordination: Coordination normal.     Deep Tendon Reflexes: Reflexes normal.  Psychiatric:        Mood and Affect: Mood normal.        Behavior: Behavior normal.     Comments: Tearful at times.       Office Visit from 09/25/2019 in [facility]  PHQ-9 Total Score 15          Assessment/plan: 1. Depression, major, single episode, mild (HCC) Moderately severe depression that is affecting her life with sleep issues and getting up to go to work. Part of this is situational with divorce. I recommend she start counseling and information given for our SW in our clinic, lisa flores as well as regular exercise routine. We are also going to start her on low dose prozac at 20mg /day. I've explained to her that drugs of the SSRI class can have side effects such as weight gain, sexual dysfunction, insomnia, headache, nausea. These medications are generally effective at alleviating symptoms of anxiety and/or depression. Let me know if significant side effects do occur. Any si/hi she is to call 911 or go to ER. Close f/u in 1-2 months.   - TSH - VITAMIN D 25 Hydroxy (Vit-D Deficiency, Fractures)  2. Dizziness Not truly dizzy and more head fullness. Checking labs and recommend she get her eyes checked, but I think her symptoms  more related to lack of sleep and depression.  - CBC with Differential/Platelet - Comprehensive metabolic panel  3. Prediabetes  - Hemoglobin A1c  This visit occurred during the SARS-CoV-2 public health emergency.  Safety protocols were in place, including screening questions prior to the visit, additional usage of staff PPE, and extensive cleaning of exam room while observing appropriate contact time as indicated for disinfecting solutions.      Return for keep 9/3 appointment. annual/depression f/u  .    11/3, MD [facility]   09/25/2019

## 2019-09-26 ENCOUNTER — Other Ambulatory Visit: Payer: Self-pay | Admitting: Family Medicine

## 2019-09-26 DIAGNOSIS — E559 Vitamin D deficiency, unspecified: Secondary | ICD-10-CM | POA: Insufficient documentation

## 2019-09-26 LAB — COMPREHENSIVE METABOLIC PANEL
ALT: 8 U/L (ref 0–35)
AST: 10 U/L (ref 0–37)
Albumin: 4.4 g/dL (ref 3.5–5.2)
Alkaline Phosphatase: 44 U/L (ref 39–117)
BUN: 14 mg/dL (ref 6–23)
CO2: 31 mEq/L (ref 19–32)
Calcium: 9.6 mg/dL (ref 8.4–10.5)
Chloride: 102 mEq/L (ref 96–112)
Creatinine, Ser: 0.81 mg/dL (ref 0.40–1.20)
GFR: 85.39 mL/min (ref >60.00)
Glucose, Bld: 99 mg/dL (ref 70–99)
Potassium: 3.8 mEq/L (ref 3.5–5.1)
Sodium: 140 mEq/L (ref 135–145)
Total Bilirubin: 0.4 mg/dL (ref 0.2–1.2)
Total Protein: 7.1 g/dL (ref 6.0–8.3)

## 2019-09-26 LAB — CBC WITH DIFFERENTIAL/PLATELET
Basophils Absolute: 0.1 10*3/uL (ref 0.0–0.1)
Basophils Relative: 0.8 % (ref 0.0–3.0)
Eosinophils Absolute: 0 10*3/uL (ref 0.0–0.7)
Eosinophils Relative: 0.7 % (ref 0.0–5.0)
HCT: 39 % (ref 36.0–46.0)
Hemoglobin: 13 g/dL (ref 12.0–15.0)
Lymphocytes Relative: 31.3 % (ref 12.0–46.0)
Lymphs Abs: 2 10*3/uL (ref 0.7–4.0)
MCHC: 33.4 g/dL (ref 30.0–36.0)
MCV: 85 fl (ref 78.0–100.0)
Monocytes Absolute: 0.3 10*3/uL (ref 0.1–1.0)
Monocytes Relative: 4.6 % (ref 3.0–12.0)
Neutro Abs: 4.1 10*3/uL (ref 1.4–7.7)
Neutrophils Relative %: 62.6 % (ref 43.0–77.0)
Platelets: 238 10*3/uL (ref 150.0–400.0)
RBC: 4.59 Mil/uL (ref 3.87–5.11)
RDW: 13.2 % (ref 11.5–15.5)
WBC: 6.5 10*3/uL (ref 4.0–10.5)

## 2019-09-26 LAB — VITAMIN D 25 HYDROXY (VIT D DEFICIENCY, FRACTURES): VITD: 17.52 ng/mL — ABNORMAL LOW (ref 30.00–100.00)

## 2019-09-26 LAB — TSH: TSH: 1.21 u[IU]/mL (ref 0.35–4.50)

## 2019-09-26 LAB — HEMOGLOBIN A1C: Hgb A1c MFr Bld: 5.4 % (ref 4.6–6.5)

## 2019-09-26 MED ORDER — VITAMIN D (ERGOCALCIFEROL) 1.25 MG (50000 UNIT) PO CAPS: ORAL_CAPSULE | ORAL | 0 refills | Status: AC

## 2019-10-11 ENCOUNTER — Other Ambulatory Visit: Payer: Self-pay

## 2019-10-11 ENCOUNTER — Encounter: Payer: Self-pay | Admitting: Physician Assistant

## 2019-10-11 ENCOUNTER — Telehealth: Payer: Self-pay | Admitting: Family Medicine

## 2019-10-11 ENCOUNTER — Ambulatory Visit (INDEPENDENT_AMBULATORY_CARE_PROVIDER_SITE_OTHER): Payer: BLUE CROSS/BLUE SHIELD | Admitting: Physician Assistant

## 2019-10-11 VITALS — BP 120/78 | HR 73 | Temp 97.5°F | Ht 66.0 in | Wt 178.2 lb

## 2019-10-11 DIAGNOSIS — R1013 Epigastric pain: Secondary | ICD-10-CM

## 2019-10-11 NOTE — Telephone Encounter (Signed)
Patient is scheduled for this afternoon     Abdominal Pain Reason for Call Symptomatic / Request for Health Information Initial Comment Having abdominal pain for the past week, anything she takes does not help with the pain. Translation No Nurse Assessment Nurse: Stefano Gaul, RN, Vera Date/Time (Eastern Time): 10/11/2019 9:02:51 AM Confirm and document reason for call. If symptomatic, describe symptoms. ---Caller states she has abd pain for the past week. No vomiting. she took prilosec which did not help. Has upper abd pain. Pain level 4. pain is intermittent. no fever. Has the patient had close contact with a person known or suspected to have the novel coronavirus illness OR traveled / lives in area with major community spread (including international travel) in the last 14 days from the onset of symptoms? * If Asymptomatic, screen for exposure and travel within the last 14 days. ---No Does the patient have any new or worsening symptoms? ---Yes Will a triage be completed? ---Yes Related visit to physician within the last 2 weeks? ---No Does the PT have any chronic conditions? (i.e. diabetes, asthma, this includes High risk factors for pregnancy, etc.) ---No Is the patient pregnant or possibly pregnant? (Ask all females between the ages of 85-55) ---No Is this a behavioral health or substance abuse call? ---No Guidelines Guideline Title Affirmed Question Affirmed Notes Nurse Date/Time Lamount Cohen Time) Abdominal Pain - Upper [1] MODERATE pain (e.g., interferes with normal activities) AND [2] comes and goes (cramps) AND [3] present > 24 hours (Exception: Stefano Gaul, RN, Vera 10/11/2019 9:06:27 AMPLEASE NOTE: All timestamps contained within this report are represented as [place] Standard Time. CONFIDENTIALTY NOTICE: This fax transmission is intended only for the addressee. It contains information that is legally privileged, confidential or otherwise protected from use or disclosure. If  you are not the intended recipient, you are strictly prohibited from reviewing, disclosing, copying using or disseminating any of this information or taking any action in reliance on or regarding this information. If you have received this fax in error, please notify us immediately by telephone so that we can arrange for its return to [place]. Phone: [phone], Toll-Free: [phone], Fax: [phone] Page: 2 of 2 Call Id: [id number] Guidelines Guideline Title Affirmed Question Affirmed Notes Nurse Date/Time Lamount Cohen Time) pain with Vomiting or Diarrhea - see that Guideline) Disp. Time Lamount Cohen Time) Disposition Final User 10/11/2019 9:13:16 AM See PCP within 24 Hours Yes Stefano Gaul, Charity fundraiser, Dwana Curd

## 2019-10-11 NOTE — Telephone Encounter (Signed)
FYI

## 2019-10-11 NOTE — Progress Notes (Signed)
Renee Gibbs is a 24 y.o. female here for a new problem.  I acted as a Neurosurgeon for [employer], PA-C Corky Mull, LPN   History of Present Illness:   Chief Complaint  Patient presents with  . Abdominal Pain    epigastric pain    HPI   Abdominal pain Pt c/o epigastric pain x 1 week, sharp pain off and on, otherwise dull pain. She is having some nausea and vomited x 1 last week -- emesis was orange. Pt is taking Prilosec OTC, no relief, took for about 4 days. Had ETOH on Monday, 1.5 bottle of [employer]. Did not have worsening symptoms with this. Had regular BM today, but it was harder than normal. Has had loose stools as well, overall, endorses constipation x 1 month. Denies blood in stool, unintentional weight changes, recent new medications. Pain does not radiate anywhere usually, but did go down her abdomen the other day.  Has had this in the past, February, and went away with Prilosec.  Denies pain with urination. Does have burning pain with masturbation, but only initially, goes away within a few seconds. Last sexually active in January, no prior hx of STDs. Denies vaginal discharge, rash, itching.  Patient's last menstrual period was 07/25/2019.   Past Medical History:  Diagnosis Date  . Chronic nephritis   . Renal disorder      Social History   Tobacco Use  . Smoking status: Never Smoker  . Smokeless tobacco: Never Used  Substance Use Topics  . Alcohol use: No  . Drug use: No    History reviewed. No pertinent surgical history.  Family History  Problem Relation Age of Onset  . Kidney Stones Other     Allergies  Allergen Reactions  . Metformin And Related Diarrhea    Current Medications:   Current Outpatient Medications:  Marland Kitchen  Vitamin D, Ergocalciferol, (DRISDOL) 1.25 MG (50000 UNIT) CAPS capsule, One capsule by mouth once a week for 12 weeks. Then take 2000IU/day, Disp: 12 capsule, Rfl: 0   Review of Systems:   ROS  Negative unless otherwise specified  per HPI.  Vitals:   Vitals:   10/11/19 1403  BP: 120/78  Pulse: 73  Temp: (!) 97.5 F (36.4 C)  TempSrc: Temporal  SpO2: 97%  Weight: 178 lb 4 oz (80.9 kg)  Height: 5\' 6"  (1.676 m)     Body mass index is 28.77 kg/m.  Physical Exam:   Physical Exam Vitals and nursing note reviewed.  Constitutional:      General: She is not in acute distress.    Appearance: She is well-developed. She is not ill-appearing or toxic-appearing.  Cardiovascular:     Rate and Rhythm: Normal rate and regular rhythm.     Pulses: Normal pulses.     Heart sounds: Normal heart sounds, S1 normal and S2 normal.     Comments: No LE edema Pulmonary:     Effort: Pulmonary effort is normal.     Breath sounds: Normal breath sounds.  Abdominal:     General: Abdomen is flat. Bowel sounds are normal.     Palpations: Abdomen is soft.     Tenderness: There is abdominal tenderness in the epigastric area and periumbilical area. There is no right CVA tenderness or left CVA tenderness.  Skin:    General: Skin is warm and dry.  Neurological:     Mental Status: She is alert.     GCS: GCS eye subscore is 4. GCS verbal subscore  is 5. GCS motor subscore is 6.  Psychiatric:        Speech: Speech normal.        Behavior: Behavior normal. Behavior is cooperative.       Assessment and Plan:   Jacquetta was seen today for abdominal pain.  Diagnoses and all orders for this visit:  Epigastric abdominal pain Denies concerns for pregnancy. Declined labs and urine tests today, as she states that she wants to contact her insurance company regarding this prior to having this done to discuss cost. Discussed differential dx include: IBS, constipation, GERD, gastritis, ulcer, among others. I have recommended miralax 1 capful daily to see if this will help regulate her bowels, continue prilosec daily. Follow-up with PCP if no improvement or worsening of symptoms; worsening precautions advised. -     Urinalysis, Routine w reflex  microscopic -     POCT urine pregnancy -     CBC with Differential/Platelet; Future -     Comprehensive metabolic panel; Future -     Lipase; Future    . Reviewed expectations re: course of current medical issues. . Discussed self-management of symptoms. . Outlined signs and symptoms indicating need for more acute intervention. . Patient verbalized understanding and all questions were answered. . See orders for this visit as documented in the electronic medical record. . Patient received an After-Visit Summary.  CMA or LPN served as scribe during this visit. History, Physical, and Plan performed by medical provider. The above documentation has been reviewed and is accurate and complete.  I spent 25 minutes with this patient, greater than 50% was face-to-face time counseling regarding the above diagnoses.   Jarold Motto, PA-C

## 2019-10-11 NOTE — Patient Instructions (Signed)
It was great to see you!  Work on having more regular bowel movements (1 capful of miralax daily should help)  Continue prilosec  Avoid excessive ibuprofen  Let's follow-up if symptoms do not improve of if they worsen. Take care,  Jarold Motto PA-C

## 2019-11-29 ENCOUNTER — Encounter: Payer: BLUE CROSS/BLUE SHIELD | Admitting: Family Medicine

## 2019-11-29 DIAGNOSIS — Z0289 Encounter for other administrative examinations: Secondary | ICD-10-CM
